# Patient Record
Sex: Male | Born: 1935
Health system: Southern US, Community
[De-identification: ages and names within clinical notes are randomized; demographics above are authoritative.]

## PROBLEM LIST (undated history)

## (undated) DIAGNOSIS — E785 Hyperlipidemia, unspecified: Secondary | ICD-10-CM

## (undated) DIAGNOSIS — N289 Disorder of kidney and ureter, unspecified: Secondary | ICD-10-CM

## (undated) DIAGNOSIS — I519 Heart disease, unspecified: Secondary | ICD-10-CM

## (undated) DIAGNOSIS — K635 Polyp of colon: Secondary | ICD-10-CM

## (undated) DIAGNOSIS — B019 Varicella without complication: Secondary | ICD-10-CM

## (undated) DIAGNOSIS — J302 Other seasonal allergic rhinitis: Secondary | ICD-10-CM

## (undated) DIAGNOSIS — I2510 Atherosclerotic heart disease of native coronary artery without angina pectoris: Secondary | ICD-10-CM

## (undated) DIAGNOSIS — M1990 Unspecified osteoarthritis, unspecified site: Secondary | ICD-10-CM

## (undated) DIAGNOSIS — M109 Gout, unspecified: Secondary | ICD-10-CM

## (undated) DIAGNOSIS — N529 Male erectile dysfunction, unspecified: Secondary | ICD-10-CM

## (undated) DIAGNOSIS — I10 Essential (primary) hypertension: Secondary | ICD-10-CM

## (undated) DIAGNOSIS — N400 Enlarged prostate without lower urinary tract symptoms: Secondary | ICD-10-CM

## (undated) HISTORY — DX: Essential (primary) hypertension: I10

## (undated) HISTORY — DX: Heart disease, unspecified: I51.9

## (undated) HISTORY — PX: CATARACT EXTRACTION: SUR2

## (undated) HISTORY — DX: Atherosclerotic heart disease of native coronary artery without angina pectoris: I25.10

## (undated) HISTORY — DX: Varicella without complication: B01.9

## (undated) HISTORY — DX: Benign prostatic hyperplasia without lower urinary tract symptoms: N40.0

## (undated) HISTORY — DX: Disorder of kidney and ureter, unspecified: N28.9

## (undated) HISTORY — PX: APPENDECTOMY: SHX54

## (undated) HISTORY — DX: Gout, unspecified: M10.9

## (undated) HISTORY — DX: Hyperlipidemia, unspecified: E78.5

## (undated) HISTORY — DX: Polyp of colon: K63.5

## (undated) HISTORY — DX: Unspecified osteoarthritis, unspecified site: M19.90

## (undated) HISTORY — DX: Male erectile dysfunction, unspecified: N52.9

## (undated) HISTORY — DX: Other seasonal allergic rhinitis: J30.2

---

## 1998-06-25 HISTORY — PX: APPENDECTOMY: SHX54

## 2014-06-25 HISTORY — PX: CORONARY ANGIOPLASTY WITH STENT PLACEMENT: SHX49

## 2015-02-07 LAB — HM COLONOSCOPY

## 2015-03-14 HISTORY — PX: OTHER SURGICAL HISTORY: SHX169

## 2015-06-26 HISTORY — PX: CATARACT EXTRACTION: SUR2

## 2015-06-28 DIAGNOSIS — M546 Pain in thoracic spine: Secondary | ICD-10-CM | POA: Diagnosis not present

## 2015-06-28 DIAGNOSIS — M9903 Segmental and somatic dysfunction of lumbar region: Secondary | ICD-10-CM | POA: Diagnosis not present

## 2015-06-28 DIAGNOSIS — M545 Low back pain: Secondary | ICD-10-CM | POA: Diagnosis not present

## 2015-06-28 DIAGNOSIS — R278 Other lack of coordination: Secondary | ICD-10-CM | POA: Diagnosis not present

## 2015-06-28 DIAGNOSIS — M9905 Segmental and somatic dysfunction of pelvic region: Secondary | ICD-10-CM | POA: Diagnosis not present

## 2015-06-28 DIAGNOSIS — M9902 Segmental and somatic dysfunction of thoracic region: Secondary | ICD-10-CM | POA: Diagnosis not present

## 2015-06-28 DIAGNOSIS — M25571 Pain in right ankle and joints of right foot: Secondary | ICD-10-CM | POA: Diagnosis not present

## 2015-06-28 DIAGNOSIS — R201 Hypoesthesia of skin: Secondary | ICD-10-CM | POA: Diagnosis not present

## 2015-06-28 DIAGNOSIS — M25559 Pain in unspecified hip: Secondary | ICD-10-CM | POA: Diagnosis not present

## 2015-07-05 DIAGNOSIS — L259 Unspecified contact dermatitis, unspecified cause: Secondary | ICD-10-CM | POA: Diagnosis not present

## 2015-07-05 DIAGNOSIS — I10 Essential (primary) hypertension: Secondary | ICD-10-CM | POA: Diagnosis not present

## 2015-07-12 DIAGNOSIS — R278 Other lack of coordination: Secondary | ICD-10-CM | POA: Diagnosis not present

## 2015-07-12 DIAGNOSIS — M9903 Segmental and somatic dysfunction of lumbar region: Secondary | ICD-10-CM | POA: Diagnosis not present

## 2015-07-12 DIAGNOSIS — M9905 Segmental and somatic dysfunction of pelvic region: Secondary | ICD-10-CM | POA: Diagnosis not present

## 2015-07-12 DIAGNOSIS — M25559 Pain in unspecified hip: Secondary | ICD-10-CM | POA: Diagnosis not present

## 2015-07-12 DIAGNOSIS — M9902 Segmental and somatic dysfunction of thoracic region: Secondary | ICD-10-CM | POA: Diagnosis not present

## 2015-07-12 DIAGNOSIS — M25571 Pain in right ankle and joints of right foot: Secondary | ICD-10-CM | POA: Diagnosis not present

## 2015-07-12 DIAGNOSIS — M546 Pain in thoracic spine: Secondary | ICD-10-CM | POA: Diagnosis not present

## 2015-07-12 DIAGNOSIS — M545 Low back pain: Secondary | ICD-10-CM | POA: Diagnosis not present

## 2015-07-12 DIAGNOSIS — R201 Hypoesthesia of skin: Secondary | ICD-10-CM | POA: Diagnosis not present

## 2015-07-15 DIAGNOSIS — M25571 Pain in right ankle and joints of right foot: Secondary | ICD-10-CM | POA: Diagnosis not present

## 2015-07-15 DIAGNOSIS — M9905 Segmental and somatic dysfunction of pelvic region: Secondary | ICD-10-CM | POA: Diagnosis not present

## 2015-07-15 DIAGNOSIS — M25559 Pain in unspecified hip: Secondary | ICD-10-CM | POA: Diagnosis not present

## 2015-07-15 DIAGNOSIS — M546 Pain in thoracic spine: Secondary | ICD-10-CM | POA: Diagnosis not present

## 2015-07-15 DIAGNOSIS — M9902 Segmental and somatic dysfunction of thoracic region: Secondary | ICD-10-CM | POA: Diagnosis not present

## 2015-07-15 DIAGNOSIS — R201 Hypoesthesia of skin: Secondary | ICD-10-CM | POA: Diagnosis not present

## 2015-07-15 DIAGNOSIS — R278 Other lack of coordination: Secondary | ICD-10-CM | POA: Diagnosis not present

## 2015-07-15 DIAGNOSIS — M9903 Segmental and somatic dysfunction of lumbar region: Secondary | ICD-10-CM | POA: Diagnosis not present

## 2015-07-15 DIAGNOSIS — M545 Low back pain: Secondary | ICD-10-CM | POA: Diagnosis not present

## 2015-07-19 DIAGNOSIS — I10 Essential (primary) hypertension: Secondary | ICD-10-CM | POA: Diagnosis not present

## 2015-07-19 DIAGNOSIS — I2510 Atherosclerotic heart disease of native coronary artery without angina pectoris: Secondary | ICD-10-CM | POA: Diagnosis not present

## 2015-07-19 DIAGNOSIS — E7800 Pure hypercholesterolemia, unspecified: Secondary | ICD-10-CM | POA: Diagnosis not present

## 2015-08-16 DIAGNOSIS — R278 Other lack of coordination: Secondary | ICD-10-CM | POA: Diagnosis not present

## 2015-08-16 DIAGNOSIS — M25571 Pain in right ankle and joints of right foot: Secondary | ICD-10-CM | POA: Diagnosis not present

## 2015-08-16 DIAGNOSIS — M542 Cervicalgia: Secondary | ICD-10-CM | POA: Diagnosis not present

## 2015-08-16 DIAGNOSIS — M545 Low back pain: Secondary | ICD-10-CM | POA: Diagnosis not present

## 2015-08-16 DIAGNOSIS — M9903 Segmental and somatic dysfunction of lumbar region: Secondary | ICD-10-CM | POA: Diagnosis not present

## 2015-08-16 DIAGNOSIS — M9902 Segmental and somatic dysfunction of thoracic region: Secondary | ICD-10-CM | POA: Diagnosis not present

## 2015-08-16 DIAGNOSIS — M546 Pain in thoracic spine: Secondary | ICD-10-CM | POA: Diagnosis not present

## 2015-08-16 DIAGNOSIS — M9901 Segmental and somatic dysfunction of cervical region: Secondary | ICD-10-CM | POA: Diagnosis not present

## 2015-08-16 DIAGNOSIS — R201 Hypoesthesia of skin: Secondary | ICD-10-CM | POA: Diagnosis not present

## 2015-08-25 DIAGNOSIS — M542 Cervicalgia: Secondary | ICD-10-CM | POA: Diagnosis not present

## 2015-08-25 DIAGNOSIS — R201 Hypoesthesia of skin: Secondary | ICD-10-CM | POA: Diagnosis not present

## 2015-08-25 DIAGNOSIS — M9903 Segmental and somatic dysfunction of lumbar region: Secondary | ICD-10-CM | POA: Diagnosis not present

## 2015-08-25 DIAGNOSIS — M25571 Pain in right ankle and joints of right foot: Secondary | ICD-10-CM | POA: Diagnosis not present

## 2015-08-25 DIAGNOSIS — M9901 Segmental and somatic dysfunction of cervical region: Secondary | ICD-10-CM | POA: Diagnosis not present

## 2015-08-25 DIAGNOSIS — M9902 Segmental and somatic dysfunction of thoracic region: Secondary | ICD-10-CM | POA: Diagnosis not present

## 2015-08-25 DIAGNOSIS — M545 Low back pain: Secondary | ICD-10-CM | POA: Diagnosis not present

## 2015-08-25 DIAGNOSIS — M546 Pain in thoracic spine: Secondary | ICD-10-CM | POA: Diagnosis not present

## 2015-08-30 DIAGNOSIS — M9902 Segmental and somatic dysfunction of thoracic region: Secondary | ICD-10-CM | POA: Diagnosis not present

## 2015-08-30 DIAGNOSIS — R278 Other lack of coordination: Secondary | ICD-10-CM | POA: Diagnosis not present

## 2015-08-30 DIAGNOSIS — M546 Pain in thoracic spine: Secondary | ICD-10-CM | POA: Diagnosis not present

## 2015-08-30 DIAGNOSIS — M9903 Segmental and somatic dysfunction of lumbar region: Secondary | ICD-10-CM | POA: Diagnosis not present

## 2015-08-30 DIAGNOSIS — M545 Low back pain: Secondary | ICD-10-CM | POA: Diagnosis not present

## 2015-08-30 DIAGNOSIS — R201 Hypoesthesia of skin: Secondary | ICD-10-CM | POA: Diagnosis not present

## 2015-08-30 DIAGNOSIS — M9901 Segmental and somatic dysfunction of cervical region: Secondary | ICD-10-CM | POA: Diagnosis not present

## 2015-08-30 DIAGNOSIS — M542 Cervicalgia: Secondary | ICD-10-CM | POA: Diagnosis not present

## 2015-08-30 DIAGNOSIS — M25571 Pain in right ankle and joints of right foot: Secondary | ICD-10-CM | POA: Diagnosis not present

## 2015-08-30 DIAGNOSIS — M62838 Other muscle spasm: Secondary | ICD-10-CM | POA: Diagnosis not present

## 2015-12-09 DIAGNOSIS — N529 Male erectile dysfunction, unspecified: Secondary | ICD-10-CM | POA: Diagnosis not present

## 2015-12-09 DIAGNOSIS — I2510 Atherosclerotic heart disease of native coronary artery without angina pectoris: Secondary | ICD-10-CM | POA: Diagnosis not present

## 2015-12-09 DIAGNOSIS — M79604 Pain in right leg: Secondary | ICD-10-CM | POA: Diagnosis not present

## 2015-12-16 DIAGNOSIS — M545 Low back pain: Secondary | ICD-10-CM | POA: Diagnosis not present

## 2015-12-16 DIAGNOSIS — M25572 Pain in left ankle and joints of left foot: Secondary | ICD-10-CM | POA: Diagnosis not present

## 2015-12-16 DIAGNOSIS — M5416 Radiculopathy, lumbar region: Secondary | ICD-10-CM | POA: Diagnosis not present

## 2015-12-16 DIAGNOSIS — M9902 Segmental and somatic dysfunction of thoracic region: Secondary | ICD-10-CM | POA: Diagnosis not present

## 2015-12-16 DIAGNOSIS — R278 Other lack of coordination: Secondary | ICD-10-CM | POA: Diagnosis not present

## 2015-12-16 DIAGNOSIS — M9903 Segmental and somatic dysfunction of lumbar region: Secondary | ICD-10-CM | POA: Diagnosis not present

## 2015-12-16 DIAGNOSIS — M9905 Segmental and somatic dysfunction of pelvic region: Secondary | ICD-10-CM | POA: Diagnosis not present

## 2015-12-16 DIAGNOSIS — R201 Hypoesthesia of skin: Secondary | ICD-10-CM | POA: Diagnosis not present

## 2015-12-16 DIAGNOSIS — M25559 Pain in unspecified hip: Secondary | ICD-10-CM | POA: Diagnosis not present

## 2015-12-16 DIAGNOSIS — M546 Pain in thoracic spine: Secondary | ICD-10-CM | POA: Diagnosis not present

## 2016-01-03 DIAGNOSIS — M545 Low back pain: Secondary | ICD-10-CM | POA: Diagnosis not present

## 2016-01-03 DIAGNOSIS — M25572 Pain in left ankle and joints of left foot: Secondary | ICD-10-CM | POA: Diagnosis not present

## 2016-01-03 DIAGNOSIS — M9902 Segmental and somatic dysfunction of thoracic region: Secondary | ICD-10-CM | POA: Diagnosis not present

## 2016-01-03 DIAGNOSIS — M9904 Segmental and somatic dysfunction of sacral region: Secondary | ICD-10-CM | POA: Diagnosis not present

## 2016-01-03 DIAGNOSIS — R201 Hypoesthesia of skin: Secondary | ICD-10-CM | POA: Diagnosis not present

## 2016-01-03 DIAGNOSIS — M546 Pain in thoracic spine: Secondary | ICD-10-CM | POA: Diagnosis not present

## 2016-01-03 DIAGNOSIS — R278 Other lack of coordination: Secondary | ICD-10-CM | POA: Diagnosis not present

## 2016-01-03 DIAGNOSIS — M25571 Pain in right ankle and joints of right foot: Secondary | ICD-10-CM | POA: Diagnosis not present

## 2016-01-03 DIAGNOSIS — M25559 Pain in unspecified hip: Secondary | ICD-10-CM | POA: Diagnosis not present

## 2016-01-03 DIAGNOSIS — M9903 Segmental and somatic dysfunction of lumbar region: Secondary | ICD-10-CM | POA: Diagnosis not present

## 2016-04-10 DIAGNOSIS — I2510 Atherosclerotic heart disease of native coronary artery without angina pectoris: Secondary | ICD-10-CM | POA: Diagnosis not present

## 2016-04-10 DIAGNOSIS — E785 Hyperlipidemia, unspecified: Secondary | ICD-10-CM | POA: Diagnosis not present

## 2016-04-10 DIAGNOSIS — I10 Essential (primary) hypertension: Secondary | ICD-10-CM | POA: Diagnosis not present

## 2016-04-16 DIAGNOSIS — L239 Allergic contact dermatitis, unspecified cause: Secondary | ICD-10-CM | POA: Diagnosis not present

## 2016-04-16 DIAGNOSIS — D229 Melanocytic nevi, unspecified: Secondary | ICD-10-CM | POA: Diagnosis not present

## 2016-04-16 DIAGNOSIS — D489 Neoplasm of uncertain behavior, unspecified: Secondary | ICD-10-CM | POA: Diagnosis not present

## 2016-04-18 DIAGNOSIS — Z23 Encounter for immunization: Secondary | ICD-10-CM | POA: Diagnosis not present

## 2016-06-08 DIAGNOSIS — R7309 Other abnormal glucose: Secondary | ICD-10-CM | POA: Diagnosis not present

## 2016-06-08 DIAGNOSIS — Z23 Encounter for immunization: Secondary | ICD-10-CM | POA: Diagnosis not present

## 2016-06-08 DIAGNOSIS — I10 Essential (primary) hypertension: Secondary | ICD-10-CM | POA: Diagnosis not present

## 2016-06-08 DIAGNOSIS — N529 Male erectile dysfunction, unspecified: Secondary | ICD-10-CM | POA: Diagnosis not present

## 2016-06-08 DIAGNOSIS — I2510 Atherosclerotic heart disease of native coronary artery without angina pectoris: Secondary | ICD-10-CM | POA: Diagnosis not present

## 2016-06-08 DIAGNOSIS — M109 Gout, unspecified: Secondary | ICD-10-CM | POA: Diagnosis not present

## 2016-06-08 DIAGNOSIS — Z9889 Other specified postprocedural states: Secondary | ICD-10-CM | POA: Diagnosis not present

## 2016-06-08 DIAGNOSIS — M21969 Unspecified acquired deformity of unspecified lower leg: Secondary | ICD-10-CM | POA: Diagnosis not present

## 2016-06-08 DIAGNOSIS — E7800 Pure hypercholesterolemia, unspecified: Secondary | ICD-10-CM | POA: Diagnosis not present

## 2016-06-08 DIAGNOSIS — F5104 Psychophysiologic insomnia: Secondary | ICD-10-CM | POA: Diagnosis not present

## 2016-07-10 DIAGNOSIS — M1712 Unilateral primary osteoarthritis, left knee: Secondary | ICD-10-CM | POA: Diagnosis not present

## 2016-07-10 DIAGNOSIS — M1711 Unilateral primary osteoarthritis, right knee: Secondary | ICD-10-CM | POA: Diagnosis not present

## 2016-07-10 DIAGNOSIS — M25562 Pain in left knee: Secondary | ICD-10-CM | POA: Diagnosis not present

## 2016-07-10 DIAGNOSIS — M25561 Pain in right knee: Secondary | ICD-10-CM | POA: Diagnosis not present

## 2016-12-07 DIAGNOSIS — I2510 Atherosclerotic heart disease of native coronary artery without angina pectoris: Secondary | ICD-10-CM | POA: Diagnosis not present

## 2016-12-07 LAB — BASIC METABOLIC PANEL
BUN: 23 — AB (ref 4–21)
CREATININE: 1.5 — AB (ref 0.6–1.3)
GLUCOSE: 113
POTASSIUM: 4.3 (ref 3.4–5.3)
Sodium: 137 (ref 137–147)

## 2016-12-07 LAB — LIPID PANEL
Cholesterol: 156 (ref 0–200)
HDL: 43 (ref 35–70)
LDL Cholesterol: 75
Triglycerides: 190 — AB (ref 40–160)

## 2016-12-07 LAB — HEPATIC FUNCTION PANEL
ALT: 17 (ref 10–40)
AST: 21 (ref 14–40)
BILIRUBIN, TOTAL: 0.6

## 2017-01-08 ENCOUNTER — Encounter: Payer: Self-pay | Admitting: Internal Medicine

## 2017-01-08 DIAGNOSIS — I10 Essential (primary) hypertension: Secondary | ICD-10-CM | POA: Diagnosis not present

## 2017-01-08 DIAGNOSIS — I2510 Atherosclerotic heart disease of native coronary artery without angina pectoris: Secondary | ICD-10-CM | POA: Diagnosis not present

## 2017-01-08 DIAGNOSIS — R7303 Prediabetes: Secondary | ICD-10-CM | POA: Diagnosis not present

## 2017-01-08 DIAGNOSIS — G4700 Insomnia, unspecified: Secondary | ICD-10-CM | POA: Diagnosis not present

## 2017-04-29 DIAGNOSIS — H6121 Impacted cerumen, right ear: Secondary | ICD-10-CM | POA: Diagnosis not present

## 2017-04-29 DIAGNOSIS — I10 Essential (primary) hypertension: Secondary | ICD-10-CM | POA: Diagnosis not present

## 2017-04-30 ENCOUNTER — Telehealth: Payer: Self-pay | Admitting: Internal Medicine

## 2017-04-30 NOTE — Telephone Encounter (Signed)
This pt called stating that he is new to the area and is looking for an internal medicare physician. He was referred to you and wanted to know if you would be willing to see him as a new patient. I think you would be a good fit for this patient. Please advise.   email: [email or web address]

## 2017-05-07 NOTE — Telephone Encounter (Signed)
Pt called back in and would like to know if either one of you would be willing to take him on has a pt?

## 2017-05-08 NOTE — Telephone Encounter (Signed)
Unfortunately, I'm not able to accept any more new patients at this time.  I'm sorry! Thank you!  

## 2017-05-08 NOTE — Telephone Encounter (Signed)
OK with me but please let him know that if it is important to him, I do not treat chronic pain with schedule II or higher narcotics in my practice. thanks

## 2017-05-09 NOTE — Telephone Encounter (Signed)
I called pt and offered him the 1st new pt with Dr Jenny Reichmann in Jan and he wanted to look at other sites 1st

## 2017-05-14 ENCOUNTER — Ambulatory Visit (INDEPENDENT_AMBULATORY_CARE_PROVIDER_SITE_OTHER): Payer: Medicare Other | Admitting: Adult Health

## 2017-05-14 ENCOUNTER — Encounter: Payer: Self-pay | Admitting: Adult Health

## 2017-05-14 VITALS — BP 130/64 | Temp 98.0°F | Ht 72.0 in | Wt 208.0 lb

## 2017-05-14 DIAGNOSIS — M25561 Pain in right knee: Secondary | ICD-10-CM | POA: Diagnosis not present

## 2017-05-14 DIAGNOSIS — G8929 Other chronic pain: Secondary | ICD-10-CM | POA: Diagnosis not present

## 2017-05-14 DIAGNOSIS — I2510 Atherosclerotic heart disease of native coronary artery without angina pectoris: Secondary | ICD-10-CM | POA: Diagnosis not present

## 2017-05-14 DIAGNOSIS — I10 Essential (primary) hypertension: Secondary | ICD-10-CM

## 2017-05-14 DIAGNOSIS — Z7689 Persons encountering health services in other specified circumstances: Secondary | ICD-10-CM | POA: Diagnosis not present

## 2017-05-14 NOTE — Patient Instructions (Signed)
It was great meeting you today   I will refer you to cardiology and orthopedics   Please follow up with me for your physical

## 2017-05-14 NOTE — Progress Notes (Signed)
Patient presents to clinic today to establish care. He is a pleasant and active  83 year old male who  has a past medical history of Arthritis, Essential hypertension, Gout, and Hyperlipidemia.   He recently moved from [place].   Acute Concerns: Establish Care   Chronic Issues: Hypertension - He takes Norvasc and Losartan   Cardiac Stent - for blocked LAD. He was followed by cardiology in [place] and would like to see someone here in [place]. Denies any chest pain or SOB   Right sided knee pain - chronic in nature. He has had a " lubricating solution" injected into his knee in the past and had good results with this but feels as though this is wearing off.   Gout - Allopurinol   Hyperlipidemia - takes Lipitor   Health Maintenance: Dental -- Routine Care  Vision -- Routine Care  Immunizations -- UTD  Colonoscopy -- Aged out    Past Medical History:  Diagnosis Date  . Arthritis   . Essential hypertension   . Gout   . Hyperlipidemia     Past Surgical History:  Procedure Laterality Date  . APPENDECTOMY    . CORONARY ANGIOPLASTY WITH STENT PLACEMENT      Current Outpatient Medications on File Prior to Visit  Medication Sig Dispense Refill  . ALLOPURINOL PO Take by mouth.    . losartan (COZAAR) 50 MG tablet Take by mouth.    Marland Kitchen PRESCRIPTION MEDICATION AMLODIPINE     No current facility-administered medications on file prior to visit.     No Known Allergies  Family History  Problem Relation Age of Onset  . Lung cancer Mother   . Aneurysm Brother   . Cancer Paternal Grandfather   . Heart attack Maternal Grandfather     Social History   Socioeconomic History  . Marital status: Unknown    Spouse name: Not on file  . Number of children: Not on file  . Years of education: Not on file  . Highest education level: Not on file  Social Needs  . Financial resource strain: Not on file  . Food insecurity - worry: Not on file  . Food insecurity - inability:  Not on file  . Transportation needs - medical: Not on file  . Transportation needs - non-medical: Not on file  Occupational History  . Not on file  Tobacco Use  . Smoking status: Never Smoker  . Smokeless tobacco: Never Used  Substance and Sexual Activity  . Alcohol use: Yes    Alcohol/week: 12.6 oz    Types: 14 Glasses of wine, 7 Standard drinks or equivalent per week  . Drug use: No  . Sexual activity: Not on file  Other Topics Concern  . Not on file  Social History Narrative  . Not on file    Review of Systems  Constitutional: Negative.   HENT: Negative.   Eyes: Negative.   Cardiovascular: Negative.   Gastrointestinal: Negative.   Genitourinary: Negative.   Musculoskeletal: Positive for joint pain (right knee).  Neurological: Negative.   Psychiatric/Behavioral: Negative.     BP 130/64 (BP Location: Left Arm)   Temp 98 F (36.7 C) (Oral)   Ht 6' (1.829 m)   Wt 208 lb (94.3 kg)   BMI 28.21 kg/m   Physical Exam  Constitutional: He is oriented to person, place, and time and well-developed, well-nourished, and in no distress. No distress.  Cardiovascular: Normal rate, regular rhythm, normal heart sounds and intact distal pulses.  Exam reveals no gallop and no friction rub.  No murmur heard. Pulmonary/Chest: Effort normal and breath sounds normal. No respiratory distress. He has no wheezes. He has no rales. He exhibits no tenderness.  Musculoskeletal: Normal range of motion. He exhibits no tenderness or deformity.  Neurological: He is alert and oriented to person, place, and time. Gait normal. GCS score is 15.  Skin: Skin is warm and dry. No rash noted. He is not diaphoretic. No erythema. No pallor.  Surgical scar on right lower extremity    Psychiatric: Mood, memory, affect and judgment normal.  Nursing note and vitals reviewed.   Assessment/Plan: 1. Encounter to establish care -Information from [facility] in [place].  Follow-up for physical exam -Encouraged  to stay active and eat a heart healthy diet   2. Essential hypertension - Well controlled. No change in medications   3. Coronary artery disease involving native coronary artery of native heart without angina pectoris  - Ambulatory referral to Cardiology  4. Chronic pain of right knee  - AMB referral to orthopedics  Dorothyann Peng, NP

## 2017-05-23 ENCOUNTER — Ambulatory Visit (INDEPENDENT_AMBULATORY_CARE_PROVIDER_SITE_OTHER): Payer: Medicare Other

## 2017-05-23 ENCOUNTER — Encounter (INDEPENDENT_AMBULATORY_CARE_PROVIDER_SITE_OTHER): Payer: Self-pay | Admitting: Orthopedic Surgery

## 2017-05-23 ENCOUNTER — Encounter: Payer: Self-pay | Admitting: Adult Health

## 2017-05-23 ENCOUNTER — Ambulatory Visit (INDEPENDENT_AMBULATORY_CARE_PROVIDER_SITE_OTHER): Payer: Medicare Other | Admitting: Adult Health

## 2017-05-23 ENCOUNTER — Ambulatory Visit (INDEPENDENT_AMBULATORY_CARE_PROVIDER_SITE_OTHER): Payer: Medicare Other | Admitting: Orthopedic Surgery

## 2017-05-23 VITALS — BP 126/74 | Temp 98.2°F | Ht 72.0 in | Wt 207.0 lb

## 2017-05-23 DIAGNOSIS — I2510 Atherosclerotic heart disease of native coronary artery without angina pectoris: Secondary | ICD-10-CM | POA: Diagnosis not present

## 2017-05-23 DIAGNOSIS — M79661 Pain in right lower leg: Secondary | ICD-10-CM

## 2017-05-23 DIAGNOSIS — E785 Hyperlipidemia, unspecified: Secondary | ICD-10-CM

## 2017-05-23 DIAGNOSIS — M25561 Pain in right knee: Secondary | ICD-10-CM

## 2017-05-23 DIAGNOSIS — G8929 Other chronic pain: Secondary | ICD-10-CM | POA: Diagnosis not present

## 2017-05-23 DIAGNOSIS — I10 Essential (primary) hypertension: Secondary | ICD-10-CM | POA: Diagnosis not present

## 2017-05-23 LAB — LIPID PANEL
CHOLESTEROL: 180 mg/dL (ref 0–200)
CHOLESTEROL: 180 (ref 0–200)
HDL: 44 (ref 35–70)
HDL: 44.6 mg/dL (ref >39.00)
LDL Cholesterol: 105
LDL Cholesterol: 105 mg/dL — ABNORMAL HIGH (ref 0–99)
NONHDL: 135
Total CHOL/HDL Ratio: 4
Triglycerides: 152 (ref 40–160)
Triglycerides: 152 mg/dL — ABNORMAL HIGH (ref 0.0–149.0)
VLDL: 30.4 mg/dL (ref 0.0–40.0)

## 2017-05-23 LAB — BASIC METABOLIC PANEL
BUN: 25 mg/dL — ABNORMAL HIGH (ref 6–23)
CO2: 27 mEq/L (ref 19–32)
Calcium: 9.9 mg/dL (ref 8.4–10.5)
Chloride: 103 mEq/L (ref 96–112)
Creatinine, Ser: 1.48 mg/dL (ref 0.40–1.50)
Creatinine: 1.5 — AB (ref 0.6–1.3)
GFR: 48.16 mL/min — ABNORMAL LOW (ref >60.00)
Glucose, Bld: 109 mg/dL — ABNORMAL HIGH (ref 70–99)
Potassium: 4.3 (ref 3.4–5.3)
Potassium: 4.3 mEq/L (ref 3.5–5.1)
Sodium: 138 mEq/L (ref 135–145)

## 2017-05-23 LAB — HEPATIC FUNCTION PANEL
ALBUMIN: 4.4 g/dL (ref 3.5–5.2)
ALK PHOS: 68 U/L (ref 39–117)
ALT: 16 U/L (ref 0–53)
AST: 19 U/L (ref 0–37)
Bilirubin, Direct: 0.1 mg/dL (ref 0.0–0.3)
TOTAL PROTEIN: 7.8 g/dL (ref 6.0–8.3)
Total Bilirubin: 0.8 mg/dL (ref 0.2–1.2)

## 2017-05-23 LAB — CBC WITH DIFFERENTIAL/PLATELET
Basophils Absolute: 0.1 10*3/uL (ref 0.0–0.1)
Basophils Relative: 0.9 % (ref 0.0–3.0)
EOS PCT: 3.8 % (ref 0.0–5.0)
Eosinophils Absolute: 0.3 10*3/uL (ref 0.0–0.7)
HEMATOCRIT: 41.7 % (ref 39.0–52.0)
Hemoglobin: 13.8 g/dL (ref 13.0–17.0)
LYMPHS ABS: 1.4 10*3/uL (ref 0.7–4.0)
LYMPHS PCT: 18.7 % (ref 12.0–46.0)
MCHC: 33.2 g/dL (ref 30.0–36.0)
MCV: 95.3 fl (ref 78.0–100.0)
MONOS PCT: 9.6 % (ref 3.0–12.0)
Monocytes Absolute: 0.7 10*3/uL (ref 0.1–1.0)
NEUTROS ABS: 5 10*3/uL (ref 1.4–7.7)
NEUTROS PCT: 67 % (ref 43.0–77.0)
PLATELETS: 274 10*3/uL (ref 150.0–400.0)
RBC: 4.38 Mil/uL (ref 4.22–5.81)
RDW: 13.6 % (ref 11.5–15.5)
WBC: 7.4 10*3/uL (ref 4.0–10.5)

## 2017-05-23 LAB — TSH
TSH: 2.68 u[IU]/mL (ref 0.35–4.50)
TSH: 2.6 (ref 0.41–5.90)

## 2017-05-23 NOTE — Progress Notes (Signed)
Subjective:    Patient ID: Kevin Levine, male    DOB: 07-Aug-1931, 83 y.o.   MRN: [id number]  HPI  Patient presents for yearly follow up exam. He is a pleasant and active 83 year old male who  has a past medical history of Arthritis, Chicken pox, Colon polyps, ED (erectile dysfunction), Essential hypertension, Gout, Heart disease, Hyperlipidemia, Kidney disease, and Seasonal allergies.   Hypertension - He takes Norvasc and Losartan - stable   Cardiac Stent - for blocked LAD. He was followed by cardiology in [place]. Denies any chest pain or SOB   Hx of Gout - Allopurinol - stable   Hyperlipidemia - takes Lipitor   All immunizations and health maintenance protocols were reviewed with the patient and needed orders were placed. He reports that he believes he has had all of his vaccinations but we have not received his records from previous PCP   Appropriate screening laboratory values were ordered for the patient including screening of hyperlipidemia, renal function and hepatic function. If indicated by BPH, a PSA was ordered.  Medication reconciliation,  past medical history, social history, problem list and allergies were reviewed in detail with the patient  Goals were established with regard to weight loss, exercise, and  diet in compliance with medications  End of life planning was discussed. He has an advanced directive and living will.   He no longer needs to have a colonoscopy. He participates in routine dental and vision screens  He will be seeing orthopedics this afternoon for his right knee pain.   Does not have any acute issues he would like to discuss   Review of Systems  Constitutional: Negative.   HENT: Negative.   Eyes: Negative.   Respiratory: Negative.   Cardiovascular: Negative.   Gastrointestinal: Negative.   Endocrine: Negative.   Genitourinary: Negative.   Musculoskeletal: Positive for arthralgias.  Skin: Negative.   Allergic/Immunologic: Negative.    Neurological: Negative.   Hematological: Negative.   Psychiatric/Behavioral: Negative.   All other systems reviewed and are negative.  Past Medical History:  Diagnosis Date  . Arthritis   . Chicken pox   . Colon polyps   . ED (erectile dysfunction)   . Essential hypertension   . Gout   . Heart disease   . Hyperlipidemia   . Kidney disease   . Seasonal allergies     Social History   Socioeconomic History  . Marital status: Significant Other    Spouse name: Not on file  . Number of children: Not on file  . Years of education: Not on file  . Highest education level: Not on file  Social Needs  . Financial resource strain: Not on file  . Food insecurity - worry: Not on file  . Food insecurity - inability: Not on file  . Transportation needs - medical: Not on file  . Transportation needs - non-medical: Not on file  Occupational History  . Not on file  Tobacco Use  . Smoking status: Never Smoker  . Smokeless tobacco: Never Used  Substance and Sexual Activity  . Alcohol use: Yes    Alcohol/week: 12.6 oz    Types: 14 Glasses of wine, 7 Standard drinks or equivalent per week  . Drug use: No  . Sexual activity: Not on file  Other Topics Concern  . Not on file  Social History Narrative   Retired - works as an Herbalist.    Past Surgical History:  Procedure Laterality Date  .  APPENDECTOMY    . CORONARY ANGIOPLASTY WITH STENT PLACEMENT  2016    Family History  Problem Relation Age of Onset  . Lung cancer Mother   . Aneurysm Brother   . Cancer Paternal Grandfather   . Heart attack Maternal Grandfather     No Known Allergies  Current Outpatient Medications on File Prior to Visit  Medication Sig Dispense Refill  . ALLOPURINOL PO Take by mouth.    Marland Kitchen atorvastatin (LIPITOR) 80 MG tablet Take 80 mg by mouth daily.    Marland Kitchen losartan (COZAAR) 50 MG tablet Take by mouth.    Marland Kitchen PRESCRIPTION MEDICATION AMLODIPINE     No current facility-administered  medications on file prior to visit.     BP 126/74 (BP Location: Left Arm)   Temp 98.2 F (36.8 C) (Oral)   Ht 6' (1.829 m)   Wt 207 lb (93.9 kg)   BMI 28.07 kg/m       Objective:   Physical Exam  Constitutional: He is oriented to person, place, and time. He appears well-developed and well-nourished. No distress.  HENT:  Head: Normocephalic and atraumatic.  Right Ear: External ear normal.  Left Ear: External ear normal.  Nose: Nose normal.  Mouth/Throat: Oropharynx is clear and moist. No oropharyngeal exudate.  Eyes: Conjunctivae and EOM are normal. Pupils are equal, round, and reactive to light. Right eye exhibits no discharge. Left eye exhibits no discharge. No scleral icterus.  Neck: Normal range of motion. Neck supple. No JVD present. Carotid bruit is not present. No tracheal deviation present. No thyroid mass and no thyromegaly present.  Cardiovascular: Normal rate, regular rhythm, normal heart sounds and intact distal pulses. Exam reveals no gallop and no friction rub.  No murmur heard. Pulmonary/Chest: Effort normal and breath sounds normal. No stridor. No respiratory distress. He has no wheezes. He has no rales. He exhibits no tenderness.  Abdominal: Soft. Bowel sounds are normal. He exhibits no distension and no mass. There is no tenderness. There is no rebound and no guarding.  Genitourinary:  Genitourinary Comments: Deferred    Musculoskeletal: Normal range of motion. He exhibits no edema, tenderness or deformity.  Lymphadenopathy:    He has no cervical adenopathy.  Neurological: He is alert and oriented to person, place, and time. He has normal reflexes. He displays normal reflexes. No cranial nerve deficit. He exhibits normal muscle tone. Coordination normal.  Skin: Skin is warm and dry. No rash noted. He is not diaphoretic. No erythema. No pallor.  Psychiatric: He has a normal mood and affect. His behavior is normal. Judgment and thought content normal.  Nursing  note and vitals reviewed.     Assessment & Plan:  1. Essential hypertension - controlled with current medication  No change in medications  - Basic metabolic panel - CBC with Differential/Platelet - Hepatic function panel - Lipid panel - TSH  2. Hyperlipidemia, unspecified hyperlipidemia type - Consider increasing statin.   - Basic metabolic panel - CBC with Differential/Platelet - Hepatic function panel - Lipid panel - TSH   Dorothyann Peng, NP

## 2017-05-26 ENCOUNTER — Encounter (INDEPENDENT_AMBULATORY_CARE_PROVIDER_SITE_OTHER): Payer: Self-pay | Admitting: Orthopedic Surgery

## 2017-05-26 DIAGNOSIS — M25561 Pain in right knee: Secondary | ICD-10-CM | POA: Diagnosis not present

## 2017-05-26 DIAGNOSIS — G8929 Other chronic pain: Secondary | ICD-10-CM | POA: Diagnosis not present

## 2017-05-26 MED ORDER — METHYLPREDNISOLONE ACETATE 40 MG/ML IJ SUSP
40.0000 mg | INTRAMUSCULAR | Status: AC | PRN
  Administered 2017-05-26: 40 mg via INTRA_ARTICULAR

## 2017-05-26 MED ORDER — BUPIVACAINE HCL 0.25 % IJ SOLN
4.0000 mL | INTRAMUSCULAR | Status: AC | PRN
Start: 2017-05-26 — End: 2017-05-26
  Administered 2017-05-26: 4 mL via INTRA_ARTICULAR

## 2017-05-26 MED ORDER — LIDOCAINE HCL 1 % IJ SOLN
5.0000 mL | INTRAMUSCULAR | Status: AC | PRN
  Administered 2017-05-26: 5 mL

## 2017-05-26 NOTE — Progress Notes (Signed)
Office Visit Note   Patient: Kevin Levine           Date of Birth: 12/25/1931           MRN: [id number] Visit Date: 05/23/2017 Requested by: Dorothyann Peng, NP [place] [address] [place], [place] [address] PCP: Dorothyann Peng, NP  Subjective: Chief Complaint  Patient presents with  . Right Knee - Pain  . Right Leg - Pain    HPI: Kevin Levine is a patient with right knee and leg pain.  Reports calf pain as well.  Believes this may be possibly damage related to his sciatic nerve.  Describes pain for 20 years.  He also reports a history of meniscal damage in the right knee.  Pain is always lateral to midline in the tibia.  He has had an injection in the right knee 9 months ago in [place] which helped.  States that that injection is still working for pain relief in his knee.  He states he has a history of right foot drop but it is not as bad as it used to be.  He denies any low back pain.  He would like to figure out what is going on.  Reports night pain in that right leg as well as pain that wakes him from sleep at night.  He states that he had surgery in the right leg "to release the nerve" and he says that that was a bad surgery.  He states that it was the electrostimulation which helped his leg and not the surgery.this  Part of his history is somewhat confusing.              ROS: All systems reviewed are negative as they relate to the chief complaint within the history of present illness.  Patient denies  fevers or chills.   Assessment & Plan: Visit Diagnoses:  1. Pain in right lower leg   2. Chronic pain of right knee     Plan: Impression is right leg pain which is coming from 1 of both resources.  First source would be the back and sciatic nerve.  Second source would be recurrent depression around the fibular head.  Third source would be compression of the superficial peroneal nerve around the ankle.  Plan at this time is for injection into the knee for mild recurrent symptoms of  arthritis which is very evident on plain radiographs.  Need nerve conduction study of that right lower extremity to localize the area of nerve compression.  I will see him back after that study  Follow-Up Instructions: No Follow-up on file.   Orders:  Orders Placed This Encounter  Procedures  . XR Knee 1-2 Views Right  . XR Tibia/Fibula Right  . Ambulatory referral to Physical Medicine Rehab   No orders of the defined types were placed in this encounter.     Procedures: Large Joint Inj: R knee on 05/26/2017 11:42 AM Indications: diagnostic evaluation, joint swelling and pain Details: 18 G 1.5 in needle, superolateral approach  Arthrogram: No  Medications: 5 mL lidocaine 1 %; 40 mg methylPREDNISolone acetate 40 MG/ML; 4 mL bupivacaine 0.25 % Outcome: tolerated well, no immediate complications Procedure, treatment alternatives, risks and benefits explained, specific risks discussed. Consent was given by the patient. Immediately prior to procedure a time out was called to verify the correct patient, procedure, equipment, support staff and site/side marked as required. Patient was prepped and draped in the usual sterile fashion.       Clinical  Data: No additional findings.  Objective: Vital Signs: There were no vitals taken for this visit.  Physical Exam:   Constitutional: Patient appears well-developed HEENT:  Head: Normocephalic Eyes:EOM are normal Neck: Normal range of motion Cardiovascular: Normal rate Pulmonary/chest: Effort normal Neurologic: Patient is alert Skin: Skin is warm Psychiatric: Patient has normal mood and affect    Ortho Exam: Orthopedic exam demonstrates slight atrophy of the right calf compared to the left calf.  This is about 1 cm.  Pedal pulses palpable bilaterally.  Ankle dorsiflexion on the right 5- out of 5 compared to 5+ out of 5 on the left.  Ankle eversion strength on the right 5- out of 5 compared to 5+ out of 5 on the left.  He has a  well-healed surgical incision for decompression of the peroneal nerve at the right knee.  There is no effusion in the right knee.  No focal joint line tenderness.  Extensor mechanism is intact.  No other masses lymphadenopathy or skin changes noted in the right knee region.  No nerve root tension signs.  No groin pain with internal/external rotation of the leg.  Minimal pain with forward and lateral bending of the back  Specialty Comments:  No specialty comments available.  Imaging: No results found.   PMFS History: There are no active problems to display for this patient.  Past Medical History:  Diagnosis Date  . Arthritis   . Chicken pox   . Colon polyps   . ED (erectile dysfunction)   . Essential hypertension   . Gout   . Heart disease   . Hyperlipidemia   . Kidney disease   . Seasonal allergies     Family History  Problem Relation Age of Onset  . Lung cancer Mother   . Aneurysm Brother   . Cancer Paternal Grandfather   . Heart attack Maternal Grandfather     Past Surgical History:  Procedure Laterality Date  . APPENDECTOMY    . CATARACT EXTRACTION Bilateral   . CORONARY ANGIOPLASTY WITH STENT PLACEMENT  2016   Social History   Occupational History  . Not on file  Tobacco Use  . Smoking status: Never Smoker  . Smokeless tobacco: Never Used  Substance and Sexual Activity  . Alcohol use: Yes    Alcohol/week: 12.6 oz    Types: 14 Glasses of wine, 7 Standard drinks or equivalent per week  . Drug use: No  . Sexual activity: Not on file

## 2017-06-03 ENCOUNTER — Telehealth: Payer: Self-pay | Admitting: Adult Health

## 2017-06-03 NOTE — Telephone Encounter (Signed)
Copied from [place]. Topic: Quick Communication - See Telephone Encounter >> Jun 03, 2017 10:50 AM Synthia Innocent wrote: CRM for notification. See Telephone encounter for:  Requesting refill on ALLOPURINOL PO, [facility] 06/03/17.

## 2017-06-04 NOTE — Telephone Encounter (Signed)
Pt had OV 05/23/17  Do not see active order.  Routed back to provider

## 2017-06-04 NOTE — Telephone Encounter (Signed)
Cory - Please advise. Thanks! 

## 2017-06-05 NOTE — Telephone Encounter (Signed)
I just need to know what dose he is on

## 2017-06-05 NOTE — Telephone Encounter (Signed)
LMTCB for pt requesting dose/strength and directions for Allopurinol

## 2017-06-05 NOTE — Telephone Encounter (Signed)
Per pt: Allopurinol - 100 mg tabs takes 1 daily in the morning.

## 2017-06-06 ENCOUNTER — Other Ambulatory Visit: Payer: Self-pay | Admitting: Adult Health

## 2017-06-06 ENCOUNTER — Encounter (INDEPENDENT_AMBULATORY_CARE_PROVIDER_SITE_OTHER): Payer: Self-pay | Admitting: Physical Medicine and Rehabilitation

## 2017-06-06 ENCOUNTER — Ambulatory Visit (INDEPENDENT_AMBULATORY_CARE_PROVIDER_SITE_OTHER): Payer: Medicare Other | Admitting: Physical Medicine and Rehabilitation

## 2017-06-06 DIAGNOSIS — R202 Paresthesia of skin: Secondary | ICD-10-CM

## 2017-06-06 DIAGNOSIS — R531 Weakness: Secondary | ICD-10-CM

## 2017-06-06 MED ORDER — ALLOPURINOL 100 MG PO TABS: 100.0000 mg | ORAL_TABLET | Freq: Every day | ORAL | 3 refills | Status: DC

## 2017-06-06 NOTE — Progress Notes (Deleted)
Had sudden right leg and back pain about 6 years ago. Had surgery in [place] to release the nerve. Did not help. Had studies in [place] due to drop foot. Operation in [place] "damaged leg." Constant pain in right leg from knee to calf. Says right leg is smaller than left.

## 2017-06-12 ENCOUNTER — Encounter (INDEPENDENT_AMBULATORY_CARE_PROVIDER_SITE_OTHER): Payer: Self-pay | Admitting: Orthopedic Surgery

## 2017-06-12 ENCOUNTER — Ambulatory Visit (INDEPENDENT_AMBULATORY_CARE_PROVIDER_SITE_OTHER): Payer: Medicare Other | Admitting: Orthopedic Surgery

## 2017-06-12 DIAGNOSIS — M1711 Unilateral primary osteoarthritis, right knee: Secondary | ICD-10-CM

## 2017-06-12 NOTE — Progress Notes (Signed)
Kevin Levine - 83 y.o. male MRN [id number]  Date of birth: 1931-07-26  Office Visit Note: Visit Date: 06/06/2017 PCP: Kevin Peng, NP Referred by: Kevin Peng, NP  Subjective: Chief Complaint  Patient presents with  . Right Leg - Numbness, Weakness  . Right Lower Leg - Numbness, Weakness   HPI: Kevin Levine is an 83 year old gentleman who comes in today at the request of Dr. Marlou Levine for electrodiagnostic study of the right lower extremity.  He reports sudden onset of right leg and back pain about 83 years ago.  He ultimately had surgery on the right knee to release the peroneal nerve.  This was done in [place].  He feels like this did not help at all and he also feels like the operation that was performed in [place] "damaged his leg ".  He reports constant pain in the right leg from the knee to the calf.  Feels like there is atrophy of the right lower leg musculature.  He reports having on top of electrodiagnostic studies done in [place] because of a drop foot.  I do not have any of those studies to review.  He denies any left-sided complaints.  He does not really have pain down the back of the leg really more from the knee down.    ROS Otherwise per HPI.  Assessment & Plan: Visit Diagnoses:  1. Paresthesia of skin   2. Weakness     Plan: No additional findings.  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  Without prior electrodiagnostic studies to review there is no way to know if the current study shows improvement or worsening of surgery 83 years ago.  And prognostically the patient does have positive motor unit action potentials and has more chronic appearing denervation potentials.  This would portend that the patient has had some reinnervation.  There is also some evidence of early polyneuropathy which may just be  age-related.  We also had some difficulty with keeping the foot warmed during the entire procedure.  There is no significant electrodiagnostic evidence of any other focal nerve entrapment, lumbosacral plexopathy or lumbar radiculopathy.   Recommendations: 1.  Follow-up with referring physician. 2.  Continue current management of symptoms.   Meds & Orders: No orders of the defined types were placed in this encounter.   Orders Placed This Encounter  Procedures  . NCV with EMG (electromyography)    Follow-up: Return in about 2 weeks (around 06/20/2017) for Dr. Marlou Levine.   Procedures: No procedures performed  EMG & NCV Findings: Evaluation of the right Dp Br Fibular motor nerve showed prolonged distal onset latency (Poplit, 7.0 ms) and decreased conduction velocity (Poplit-Fib Head, 33 m/s).  The right fibular motor nerve showed prolonged distal onset latency (7.0 ms), reduced amplitude (0.0 mV), and decreased conduction velocity (Poplt-B Fib, 20 m/s).  The right tibial motor nerve showed reduced amplitude (0.9 mV).  The right saphenous sensory nerve showed prolonged distal peak latency (4.6 ms).  The right sural sensory nerve showed prolonged distal peak latency (4.7 ms) and decreased conduction velocity (Calf-Lat Mall, 30 m/s).  All remaining nerves (as indicated in the following tables) were within normal limits.    Needle evaluation of the right anterior tibialis muscle showed increased insertional activity, moderately increased spontaneous activity, increased motor unit amplitude, very increased polyphasic potentials, and diminished recruitment.  The right Fibularis  Longus muscle showed moderately increased spontaneous activity, increased motor unit amplitude, moderately increased polyphasic potentials, and diminished recruitment.  All remaining muscles (as indicated in the following table) showed no evidence of electrical instability.    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  Without prior electrodiagnostic studies to review there is no way to know if the current study shows improvement or worsening of surgery 83 years ago.  And prognostically the patient does have positive motor unit action potentials and has more chronic appearing denervation potentials.  This would portend that the patient has had some reinnervation.  There is also some evidence of early polyneuropathy which may just be age-related.  We also had some difficulty with keeping the foot warmed during the entire procedure.  There is no significant electrodiagnostic evidence of any other focal nerve entrapment, lumbosacral plexopathy or lumbar radiculopathy.   Recommendations: 1.  Follow-up with referring physician. 2.  Continue current management of symptoms.  Nerve Conduction Studies Anti Sensory Summary Table   Stim Site NR Peak (ms) Norm Peak (ms) P-T Amp (V) Norm P-T Amp Site1 Site2 Delta-P (ms) Dist (cm) Vel (m/s) Norm Vel (m/s)  Right Saphenous Anti Sensory (Ant Med Mall)  28.4C  14cm    *4.6 <4.4 3.4 >2 14cm Ant Med Mall 4.6 0.0  >32  Right Sup Fibular Anti Sensory (Ant Lat Mall)  28.4C  14 cm    4.4 <4.4 21.3 >5.0 14 cm Ant Lat Mall 4.4 14.0 32 >32  Right Sural Anti Sensory (Lat Mall)  28.5C  Calf    *4.7 <4.0 6.8 >5.0 Calf Lat Mall 4.7 14.0 *30 >35   Motor Summary Table   Stim Site NR Onset (ms) Norm Onset (ms) O-P Amp (mV) Norm O-P Amp Site1 Site2 Delta-0 (ms) Dist (cm) Vel (m/s) Norm Vel (m/s)  Right Dp Br Fibular Motor (AntTibialis)  28.6C  Fib Head    4.0 <4.2 2.2  Poplit Fib Head 3.0 10.0 *33 >40.5  Poplit    *7.0 <5.7 1.9         Right Fibular Motor (Ext Dig Brev)  28.7C  Ankle    *7.0 <6.1 *0.0 >2.5 B Fib Ankle 9.9 40.0 40 >38  B Fib    16.9  0.0  Poplt B Fib 5.1 10.0 *20 >40  Poplt    22.0  0.0         Right Tibial  Motor (Abd Hall Brev)  28.6C  Ankle    5.5 <6.1 *0.9 >3.0 Knee Ankle 10.6 44.0 42 >35  Knee    16.1  4.5          EMG   Side Muscle Nerve Root Ins Act Fibs Psw Amp Dur Poly Recrt Int Fraser Din Comment  Right AntTibialis Dp Br Peron L4-5 *Incr *2+ *2+ *Incr Nml *3+ *Reduced Nml   Right Fibularis Longus  Sup Br Peron L5-S1 Nml *2+ *2+ *Incr Nml *2+ *Reduced Nml   Right MedGastroc Tibial S1-2 Nml Nml Nml Nml Nml 0 Nml Nml   Right VastusMed Femoral L2-4 Nml Nml Nml Nml Nml 0 Nml Nml   Right BicepsFemS Sciatic L5-S1 Nml Nml Nml Nml Nml 0 Nml Nml     Nerve Conduction Studies Anti Sensory Left/Right Comparison   Stim Site L Lat (ms) R Lat (ms) L-R Lat (ms) L Amp (V) R Amp (V) L-R  Amp (%) Site1 Site2 L Vel (m/s) R Vel (m/s) L-R Vel (m/s)  Saphenous Anti Sensory (Ant Med Mall)  28.4C  14cm  *4.6   3.4  14cm Ant Med Mall     Sup Fibular Anti Sensory (Ant Lat Mall)  28.4C  14 cm  4.4   21.3  14 cm Ant Lat Mall  32   Sural Anti Sensory (Lat Mall)  28.5C  Calf  *4.7   6.8  Calf Lat Mall  *30    Motor Left/Right Comparison   Stim Site L Lat (ms) R Lat (ms) L-R Lat (ms) L Amp (mV) R Amp (mV) L-R Amp (%) Site1 Site2 L Vel (m/s) R Vel (m/s) L-R Vel (m/s)  Dp Br Fibular Motor (AntTibialis)  28.6C  Fib Head  4.0   2.2  Poplit Fib Head  *33   Poplit  *7.0   1.9        Fibular Motor (Ext Dig Brev)  28.7C  Ankle  *7.0   *0.0  B Fib Ankle  40   B Fib  16.9   0.0  Poplt B Fib  *20   Poplt  22.0   0.0        Tibial Motor (Abd Hall Brev)  28.6C  Ankle  5.5   *0.9  Knee Ankle  42   Knee  16.1   4.5           Waveforms:             Clinical History: No specialty comments available.  He reports that  has never smoked. he has never used smokeless tobacco. No results for input(s): HGBA1C, LABURIC in the last 8760 hours.  Objective:  VS:  HT:    WT:   BMI:     BP:   HR: bpm  TEMP: ( )  RESP:  Physical Exam  Musculoskeletal: He exhibits edema.  Examination of both lower limb shows a  well-healed surgical scar over the fibular head on the right.  I did not physically measure the circumference of the calves and musculature but there does appear to be may be some mild atrophy on the right compared to left.  There is bilateral and symmetric atrophy of the bilateral foot intrinsic musculature particularly the EDB.  Neurological: He exhibits normal muscle tone. Coordination normal.  Patient has subjective dysesthesia to light touch in the peroneal distribution on the right.  He does have intact sensation.  There is no clonus bilaterally  Skin: Skin is warm and dry. No rash noted. No erythema.    Ortho Exam Imaging: No results found.  Past Medical/Family/Surgical/Social History: Medications & Allergies reviewed per EMR There are no active problems to display for this patient.  Past Medical History:  Diagnosis Date  . Arthritis   . Chicken pox   . Colon polyps   . ED (erectile dysfunction)   . Essential hypertension   . Gout   . Heart disease   . Hyperlipidemia   . Kidney disease   . Seasonal allergies    Family History  Problem Relation Age of Onset  . Lung cancer Mother   . Aneurysm Brother   . Cancer Paternal Grandfather   . Heart attack Maternal Grandfather    Past Surgical History:  Procedure Laterality Date  . APPENDECTOMY    . CATARACT EXTRACTION Bilateral   . CORONARY ANGIOPLASTY WITH STENT PLACEMENT  2016   Social History   Occupational History  . Not on  file  Tobacco Use  . Smoking status: Never Smoker  . Smokeless tobacco: Never Used  Substance and Sexual Activity  . Alcohol use: Yes    Alcohol/week: 12.6 oz    Types: 14 Glasses of wine, 7 Standard drinks or equivalent per week  . Drug use: No  . Sexual activity: Not on file

## 2017-06-12 NOTE — Procedures (Signed)
EMG & NCV Findings: Evaluation of the right Dp Br Fibular motor nerve showed prolonged distal onset latency (Poplit, 7.0 ms) and decreased conduction velocity (Poplit-Fib Head, 33 m/s).  The right fibular motor nerve showed prolonged distal onset latency (7.0 ms), reduced amplitude (0.0 mV), and decreased conduction velocity (Poplt-B Fib, 20 m/s).  The right tibial motor nerve showed reduced amplitude (0.9 mV).  The right saphenous sensory nerve showed prolonged distal peak latency (4.6 ms).  The right sural sensory nerve showed prolonged distal peak latency (4.7 ms) and decreased conduction velocity (Calf-Lat Mall, 30 m/s).  All remaining nerves (as indicated in the following tables) were within normal limits.    Needle evaluation of the right anterior tibialis muscle showed increased insertional activity, moderately increased spontaneous activity, increased motor unit amplitude, very increased polyphasic potentials, and diminished recruitment.  The right Fibularis Longus muscle showed moderately increased spontaneous activity, increased motor unit amplitude, moderately increased polyphasic potentials, and diminished recruitment.  All remaining muscles (as indicated in the following table) showed no evidence of electrical instability.    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  Without prior electrodiagnostic studies to review there is no way to know if the current study shows improvement or worsening of surgery 6 years ago.  And prognostically the patient does have positive motor unit action potentials and has more chronic appearing denervation potentials.  This would portend that the patient has had some reinnervation.  There is also some evidence of early polyneuropathy which may just be age-related.  We also had some difficulty with keeping  the foot warmed during the entire procedure.  There is no significant electrodiagnostic evidence of any other focal nerve entrapment, lumbosacral plexopathy or lumbar radiculopathy.   Recommendations: 1.  Follow-up with referring physician. 2.  Continue current management of symptoms.  Nerve Conduction Studies Anti Sensory Summary Table   Stim Site NR Peak (ms) Norm Peak (ms) P-T Amp (V) Norm P-T Amp Site1 Site2 Delta-P (ms) Dist (cm) Vel (m/s) Norm Vel (m/s)  Right Saphenous Anti Sensory (Ant Med Mall)  28.4C  14cm    *4.6 <4.4 3.4 >2 14cm Ant Med Mall 4.6 0.0  >32  Right Sup Fibular Anti Sensory (Ant Lat Mall)  28.4C  14 cm    4.4 <4.4 21.3 >5.0 14 cm Ant Lat Mall 4.4 14.0 32 >32  Right Sural Anti Sensory (Lat Mall)  28.5C  Calf    *4.7 <4.0 6.8 >5.0 Calf Lat Mall 4.7 14.0 *30 >35   Motor Summary Table   Stim Site NR Onset (ms) Norm Onset (ms) O-P Amp (mV) Norm O-P Amp Site1 Site2 Delta-0 (ms) Dist (cm) Vel (m/s) Norm Vel (m/s)  Right Dp Br Fibular Motor (AntTibialis)  28.6C  Fib Head    4.0 <4.2 2.2  Poplit Fib Head 3.0 10.0 *33 >40.5  Poplit    *7.0 <5.7 1.9         Right Fibular Motor (Ext Dig Brev)  28.7C  Ankle    *7.0 <6.1 *0.0 >2.5 B Fib Ankle 9.9 40.0 40 >38  B Fib    16.9  0.0  Poplt B Fib 5.1 10.0 *20 >40  Poplt    22.0  0.0         Right Tibial Motor (Abd Hall Brev)  28.6C  Ankle    5.5 <6.1 *0.9 >3.0 Knee  Ankle 10.6 44.0 42 >35  Knee    16.1  4.5          EMG   Side Muscle Nerve Root Ins Act Fibs Psw Amp Dur Poly Recrt Int Fraser Din Comment  Right AntTibialis Dp Br Peron L4-5 *Incr *2+ *2+ *Incr Nml *3+ *Reduced Nml   Right Fibularis Longus  Sup Br Peron L5-S1 Nml *2+ *2+ *Incr Nml *2+ *Reduced Nml   Right MedGastroc Tibial S1-2 Nml Nml Nml Nml Nml 0 Nml Nml   Right VastusMed Femoral L2-4 Nml Nml Nml Nml Nml 0 Nml Nml   Right BicepsFemS Sciatic L5-S1 Nml Nml Nml Nml Nml 0 Nml Nml     Nerve Conduction Studies Anti Sensory Left/Right Comparison   Stim Site L  Lat (ms) R Lat (ms) L-R Lat (ms) L Amp (V) R Amp (V) L-R Amp (%) Site1 Site2 L Vel (m/s) R Vel (m/s) L-R Vel (m/s)  Saphenous Anti Sensory (Ant Med Mall)  28.4C  14cm  *4.6   3.4  14cm Ant Med Mall     Sup Fibular Anti Sensory (Ant Lat Mall)  28.4C  14 cm  4.4   21.3  14 cm Ant Lat Mall  32   Sural Anti Sensory (Lat Mall)  28.5C  Calf  *4.7   6.8  Calf Lat Mall  *30    Motor Left/Right Comparison   Stim Site L Lat (ms) R Lat (ms) L-R Lat (ms) L Amp (mV) R Amp (mV) L-R Amp (%) Site1 Site2 L Vel (m/s) R Vel (m/s) L-R Vel (m/s)  Dp Br Fibular Motor (AntTibialis)  28.6C  Fib Head  4.0   2.2  Poplit Fib Head  *33   Poplit  *7.0   1.9        Fibular Motor (Ext Dig Brev)  28.7C  Ankle  *7.0   *0.0  B Fib Ankle  40   B Fib  16.9   0.0  Poplt B Fib  *20   Poplt  22.0   0.0        Tibial Motor (Abd Hall Brev)  28.6C  Ankle  5.5   *0.9  Knee Ankle  42   Knee  16.1   4.5           Waveforms:

## 2017-06-15 ENCOUNTER — Encounter (INDEPENDENT_AMBULATORY_CARE_PROVIDER_SITE_OTHER): Payer: Self-pay | Admitting: Orthopedic Surgery

## 2017-06-15 DIAGNOSIS — M1711 Unilateral primary osteoarthritis, right knee: Secondary | ICD-10-CM | POA: Diagnosis not present

## 2017-06-15 MED ORDER — LIDOCAINE HCL 1 % IJ SOLN
5.0000 mL | INTRAMUSCULAR | Status: AC | PRN
  Administered 2017-06-15: 5 mL

## 2017-06-15 MED ORDER — HYALURONAN 88 MG/4ML IX SOSY
88.0000 mg | PREFILLED_SYRINGE | INTRA_ARTICULAR | Status: AC | PRN
  Administered 2017-06-15: 88 mg via INTRA_ARTICULAR

## 2017-06-15 NOTE — Progress Notes (Signed)
Office Visit Note   Patient: Kevin Levine           Date of Birth: 1932/06/16           MRN: [id number] Visit Date: 06/12/2017 Requested by: Dorothyann Peng, NP [address], [place] [address] PCP: Dorothyann Peng, NP  Subjective: Chief Complaint  Patient presents with  . Follow-up    EMG/NCV review    HPI: Presents for follow-up of right knee EMG nerve study.  Patient is also having pain in the right knee from arthritis.  The nerve study is reviewed with the patient and it shows residual peroneal nerve compression as expected.  Patient also has some arthritis in the right knee.  Scan is reviewed at length.  Patient does not really want any surgical intervention in the right knee.              ROS: All systems reviewed are negative as they relate to the chief complaint within the history of present illness.  Patient denies  fevers or chills.   Assessment & Plan: Visit Diagnoses:  1. Unilateral primary osteoarthritis, right knee     Plan: Impression is right knee pain with arthritis.  Some residual nerve compression which does not really merit any further surgical intervention.  Plan is right knee intra-articular gel injection and follow-up as needed Follow-Up Instructions: Return if symptoms worsen or fail to improve.   Orders:  No orders of the defined types were placed in this encounter.  No orders of the defined types were placed in this encounter.     Procedures: Large Joint Inj: R knee on 06/15/2017 9:33 AM Indications: diagnostic evaluation, joint swelling and pain Details: 18 G 1.5 in needle, superolateral approach  Arthrogram: No  Medications: 5 mL lidocaine 1 %; 88 mg Hyaluronan 88 MG/4ML Outcome: tolerated well, no immediate complications Procedure, treatment alternatives, risks and benefits explained, specific risks discussed. Consent was given by the patient. Immediately prior to procedure a time out was called to verify the correct patient,  procedure, equipment, support staff and site/side marked as required. Patient was prepped and draped in the usual sterile fashion.       Clinical Data: No additional findings.  Objective: Vital Signs: There were no vitals taken for this visit.  Physical Exam:   Constitutional: Patient appears well-developed HEENT:  Head: Normocephalic Eyes:EOM are normal Neck: Normal range of motion Cardiovascular: Normal rate Pulmonary/chest: Effort normal Neurologic: Patient is alert Skin: Skin is warm Psychiatric: Patient has normal mood and affect    Ortho Exam: Orthopedic exam demonstrates fairly normal gait and alignment.  Well-healed surgical incision along the outside aspect of the right knee.  No effusion in the right knee.  Extensor mechanism is intact.  No other masses lymphadenopathy or skin changes noted in the right knee region.  No groin pain with internal/external rotation of the leg.  No effusion in the right knee.  Extensor mechanism is intact.  Motor sensory function to the foot okay with slight weakness in dorsiflexion.  Specialty Comments:  No specialty comments available.  Imaging: No results found.   PMFS History: There are no active problems to display for this patient.  Past Medical History:  Diagnosis Date  . Arthritis   . Chicken pox   . Colon polyps   . ED (erectile dysfunction)   . Essential hypertension   . Gout   . Heart disease   . Hyperlipidemia   . Kidney disease   .  Seasonal allergies     Family History  Problem Relation Age of Onset  . Lung cancer Mother   . Aneurysm Brother   . Cancer Paternal Grandfather   . Heart attack Maternal Grandfather     Past Surgical History:  Procedure Laterality Date  . APPENDECTOMY    . CATARACT EXTRACTION Bilateral   . CORONARY ANGIOPLASTY WITH STENT PLACEMENT  2016   Social History   Occupational History  . Not on file  Tobacco Use  . Smoking status: Never Smoker  . Smokeless tobacco: Never Used   Substance and Sexual Activity  . Alcohol use: Yes    Alcohol/week: 12.6 oz    Types: 14 Glasses of wine, 7 Standard drinks or equivalent per week  . Drug use: No  . Sexual activity: Not on file

## 2017-07-09 ENCOUNTER — Telehealth: Payer: Self-pay | Admitting: Adult Health

## 2017-07-09 MED ORDER — ATORVASTATIN CALCIUM 80 MG PO TABS: 80.0000 mg | ORAL_TABLET | Freq: Every day | ORAL | 3 refills | Status: AC

## 2017-07-09 MED ORDER — ALLOPURINOL 100 MG PO TABS: 100.0000 mg | ORAL_TABLET | Freq: Every day | ORAL | 3 refills | Status: AC

## 2017-07-09 MED ORDER — AMLODIPINE BESYLATE 5 MG PO TABS: 5.0000 mg | ORAL_TABLET | Freq: Every day | ORAL | 3 refills | Status: DC

## 2017-07-09 MED ORDER — LOSARTAN POTASSIUM 50 MG PO TABS: 50.0000 mg | ORAL_TABLET | Freq: Every day | ORAL | 3 refills | Status: DC

## 2017-07-09 NOTE — Telephone Encounter (Signed)
Kevin Levine, newly established with you.  Please advise.

## 2017-07-09 NOTE — Telephone Encounter (Signed)
Sent to the pharmacy by e-scribe. 

## 2017-07-09 NOTE — Telephone Encounter (Signed)
Copied from [place] [id number]. Topic: Quick Communication - Rx Refill/Question >> Jul 09, 2017 10:30 AM Malena Catholic I, NT wrote: Medication: Zyloprim 100 mg,Lipitor 80 mg, Cozaar 50 mg ,Amlodipine 5 mg   Has the patient contacted their pharmacy  yes    (Agent: If no, request that the patient contact the pharmacy for the refill.ye   Preferred Pharmacy (with phone number or street name [facility] [place] [phone]  Agent: Please be advised that RX refills may take up to 3 business days. We ask that you follow-up with your pharmacy.

## 2017-07-09 NOTE — Telephone Encounter (Signed)
Ok to refill all for one year

## 2017-08-05 ENCOUNTER — Ambulatory Visit: Payer: Medicare Other | Admitting: Internal Medicine

## 2017-08-26 ENCOUNTER — Encounter (INDEPENDENT_AMBULATORY_CARE_PROVIDER_SITE_OTHER): Payer: Self-pay | Admitting: Orthopedic Surgery

## 2017-08-26 ENCOUNTER — Ambulatory Visit (INDEPENDENT_AMBULATORY_CARE_PROVIDER_SITE_OTHER): Payer: Medicare Other | Admitting: Orthopedic Surgery

## 2017-08-26 DIAGNOSIS — M1711 Unilateral primary osteoarthritis, right knee: Secondary | ICD-10-CM | POA: Diagnosis not present

## 2017-08-26 NOTE — Progress Notes (Signed)
Office Visit Note   Patient: Kevin Levine           Date of Birth: 10-19-1931           MRN: [id number] Visit Date: 08/26/2017 Requested by: Dorothyann Peng, NP [address], [place] [address] PCP: Dorothyann Peng, NP  Subjective: Chief Complaint  Patient presents with  . Right Leg - Pain    HPI: [name] is a patient with right knee pain and arthritis.  He is 84 years old but generally physiologically a little bit younger.  States that his knee is gotten some worse.  Has difficulty with stairs.  States that he is favoring the knee.  Does have a history of pseudo-drop foot on the right-hand side but on my last exam his strength was fairly symmetric.  Does report some occasional waking from sleep at night with pain in the knee.  Wants to discuss options.  He works as an Retail buyer.  He had cortisone and gel injection in November of last year and December of last year which did not help much.  He has tried a copper type brace which helped some.  He has a history of kidney problems in the family and does not want to take anti-inflammatories.              ROS: All systems reviewed are negative as they relate to the chief complaint within the history of present illness.  Patient denies  fevers or chills.   Assessment & Plan: Visit Diagnoses:  1. Unilateral primary osteoarthritis, right knee     Plan: Impression is right knee pain and arthritis.  Patient is physiologically younger than his chronologic age.  He needs to decide if he wants a knee replacement.  He cannot take anti-inflammatories because of kidney issues.  We will try some intensive physical therapy to get the quad stronger to see if that helps his pain.  Short of that he is going to have to either live with what he has or consider total knee replacement.  Even though his medial compartment arthritis is most significant radiographically he does describe global pain and I do not think he would be a great candidate for  partial knee replacement.  Follow-Up Instructions: Return if symptoms worsen or fail to improve.   Orders:  No orders of the defined types were placed in this encounter.  No orders of the defined types were placed in this encounter.     Procedures: No procedures performed   Clinical Data: No additional findings.  Objective: Vital Signs: There were no vitals taken for this visit.  Physical Exam:   Constitutional: Patient appears well-developed HEENT:  Head: Normocephalic Eyes:EOM are normal Neck: Normal range of motion Cardiovascular: Normal rate Pulmonary/chest: Effort normal Neurologic: Patient is alert Skin: Skin is warm Psychiatric: Patient has normal mood and affect    Ortho Exam: Orthopedic exam demonstrates no effusion in the right knee.  There is a little bit of synovitis.  Collateral and cruciate ligaments are stable.  He has had about a 10 degree flexion contracture but bends easily past 90 degrees.  Pedal pulses are trace palpable.  No groin pain with internal/external rotation of the leg.  Does not have medial greater than lateral joint line tenderness.  Specialty Comments:  No specialty comments available.  Imaging: No results found.   PMFS History: There are no active problems to display for this patient.  Past Medical History:  Diagnosis Date  . Arthritis   .  Chicken pox   . Colon polyps   . ED (erectile dysfunction)   . Essential hypertension   . Gout   . Heart disease   . Hyperlipidemia   . Kidney disease   . Seasonal allergies     Family History  Problem Relation Age of Onset  . Lung cancer Mother   . Aneurysm Brother   . Cancer Paternal Grandfather   . Heart attack Maternal Grandfather     Past Surgical History:  Procedure Laterality Date  . APPENDECTOMY    . CATARACT EXTRACTION Bilateral   . CORONARY ANGIOPLASTY WITH STENT PLACEMENT  2016   Social History   Occupational History  . Not on file  Tobacco Use  . Smoking  status: Never Smoker  . Smokeless tobacco: Never Used  Substance and Sexual Activity  . Alcohol use: Yes    Alcohol/week: 12.6 oz    Types: 14 Glasses of wine, 7 Standard drinks or equivalent per week  . Drug use: No  . Sexual activity: Not on file

## 2017-08-27 ENCOUNTER — Ambulatory Visit: Payer: Medicare Other | Admitting: Internal Medicine

## 2017-08-27 DIAGNOSIS — M25561 Pain in right knee: Secondary | ICD-10-CM | POA: Diagnosis not present

## 2017-09-03 ENCOUNTER — Telehealth: Payer: Self-pay | Admitting: Internal Medicine

## 2017-09-03 NOTE — Telephone Encounter (Signed)
Received incoming records from [facility] for upcoming appointment on 09/06/17 @ 2:45pm with Dr. Debara Pickett. Records located in Medical Records. 09/03/17 ab

## 2017-09-06 ENCOUNTER — Encounter: Payer: Medicare Other | Admitting: Internal Medicine

## 2017-09-06 ENCOUNTER — Encounter: Payer: Self-pay | Admitting: Internal Medicine

## 2017-09-09 ENCOUNTER — Telehealth: Payer: Self-pay | Admitting: *Deleted

## 2017-09-09 DIAGNOSIS — I2510 Atherosclerotic heart disease of native coronary artery without angina pectoris: Secondary | ICD-10-CM

## 2017-09-09 NOTE — Telephone Encounter (Signed)
Copied from [place] [id number]. Topic: Referral - Request >> Sep 09, 2017  1:41 PM Arletha Grippe wrote: Reason for CRM: pt is asking for a referral for a stress test.  He said he hasn't had one in a while.  Please refer him [facility] dr Nadyne Coombes   Cb [phone]  for pt

## 2017-09-09 NOTE — Progress Notes (Signed)
No encounter was performed.

## 2017-09-10 ENCOUNTER — Other Ambulatory Visit: Payer: Self-pay | Admitting: Adult Health

## 2017-09-10 NOTE — Progress Notes (Unsigned)
   Subjective:    Patient ID: Kevin Levine, male    DOB: [birth year], 84 y.o.   MRN: [id number]  HPI    Review of Systems     Objective:   Physical Exam        Assessment & Plan:

## 2017-09-10 NOTE — Telephone Encounter (Signed)
Spoke to the pt.  He "fired" his last cardio physician.  Now requesting to see Dr. Nadyne Coombes.  Will place referral.  Informed pt that he is now due for annual visit with [facility].  Pt to call back to schedule.

## 2017-09-11 ENCOUNTER — Other Ambulatory Visit: Payer: Self-pay | Admitting: Adult Health

## 2017-09-11 DIAGNOSIS — I519 Heart disease, unspecified: Secondary | ICD-10-CM

## 2017-09-11 DIAGNOSIS — Z955 Presence of coronary angioplasty implant and graft: Secondary | ICD-10-CM

## 2017-09-12 ENCOUNTER — Telehealth: Payer: Self-pay | Admitting: Internal Medicine

## 2017-09-12 ENCOUNTER — Encounter: Payer: Self-pay | Admitting: Family Medicine

## 2017-09-12 NOTE — Telephone Encounter (Signed)
09/11/17 - Faxed records from [facility] to Dr. Krista Blue office. Spoke with Ria Comment from Dr. Irven Shelling office. Ria Comment confirmed they received the records. Called patient. Patient is aware Dr. Irven Shelling office has records from [facility]. ab

## 2017-09-13 ENCOUNTER — Other Ambulatory Visit: Payer: Self-pay | Admitting: Adult Health

## 2017-09-13 DIAGNOSIS — Z955 Presence of coronary angioplasty implant and graft: Secondary | ICD-10-CM

## 2017-09-19 DIAGNOSIS — N183 Chronic kidney disease, stage 3 (moderate): Secondary | ICD-10-CM | POA: Diagnosis not present

## 2017-09-19 DIAGNOSIS — R600 Localized edema: Secondary | ICD-10-CM | POA: Diagnosis not present

## 2017-09-19 DIAGNOSIS — I10 Essential (primary) hypertension: Secondary | ICD-10-CM | POA: Diagnosis not present

## 2017-09-19 DIAGNOSIS — R6889 Other general symptoms and signs: Secondary | ICD-10-CM | POA: Diagnosis not present

## 2017-09-19 DIAGNOSIS — I2510 Atherosclerotic heart disease of native coronary artery without angina pectoris: Secondary | ICD-10-CM | POA: Diagnosis not present

## 2017-09-27 ENCOUNTER — Encounter: Payer: Self-pay | Admitting: Adult Health

## 2017-10-01 ENCOUNTER — Telehealth: Payer: Self-pay | Admitting: Family Medicine

## 2017-10-01 ENCOUNTER — Telehealth: Payer: Self-pay | Admitting: Adult Health

## 2017-10-01 MED ORDER — LOSARTAN POTASSIUM 100 MG PO TABS
100.0000 mg | ORAL_TABLET | Freq: Every day | ORAL | 1 refills | Status: AC
Start: 2017-10-01 — End: ?

## 2017-10-01 NOTE — Telephone Encounter (Signed)
Call to pharmacy- they shipped Rx 3/24- patient should have 90 day supply. Call to patient- he will check his bottle.   Patient also wants to know when his colonoscopy is due. Told patient I would send a note and find out. He is expecting a call to let him know.

## 2017-10-01 NOTE — Telephone Encounter (Signed)
Copied from [place] [id number]. Topic: Quick Communication - Rx Refill/Question >> Oct 01, 2017 12:38 PM Yvette Rack wrote: Medication: amLODipine (NORVASC) 5 MG tablet Has the patient contacted their pharmacy? No.  Because pt states that its a mail order (Agent: If no, request that the patient contact the pharmacy for the refill.) Preferred Pharmacy (with phone number or street name):     [place], [place] [phone] (Phone) [phone] (Fax)     Agent: Please be advised that RX refills may take up to 3 business days. We ask that you follow-up with your pharmacy.

## 2017-10-01 NOTE — Telephone Encounter (Signed)
Cory, I do not see that you changed the dosage.  50 mg is what the pt reported when he established.  Please advise.

## 2017-10-01 NOTE — Telephone Encounter (Signed)
I do have him taking Losartan 50 mg - because this is what he told me he was taking. His notes from [place] have him on 100 mg. Ok to change dose. Refill for 6 months

## 2017-10-01 NOTE — Telephone Encounter (Signed)
Sent to the pharmacy by e-scribe. 

## 2017-10-01 NOTE — Telephone Encounter (Signed)
Copied from [place] [id number]. Topic: General - Other >> Oct 01, 2017 12:43 PM Yvette Rack wrote: Reason for CRM: patient stating that the losartan (COZAAR) 50 MG tablet is not suppose to be 50 mg that he is suppose to be taking 100mg  once daily please call pt with correct dosage

## 2017-10-02 NOTE — Telephone Encounter (Signed)
Pt is 86 years.  Should he need another colonoscopy?

## 2017-10-02 NOTE — Telephone Encounter (Signed)
Pt notified of below message.  He will call his insurance company to see if they will pay for a colonoscopy.  No further action required.  Will close note.

## 2017-10-02 NOTE — Telephone Encounter (Signed)
Colonoscopies are not recommended after 75 years. If he would like another one then I would have him check with his insurance company to make sure they will pay for it

## 2017-10-03 ENCOUNTER — Encounter: Payer: Self-pay | Admitting: Adult Health

## 2017-10-04 ENCOUNTER — Telehealth: Payer: Self-pay

## 2017-10-04 NOTE — Telephone Encounter (Signed)
Copied from [place] [id number]. Topic: General - Other >> Oct 01, 2017 12:36 PM Yvette Rack wrote: Reason for CRM: patient want to know if its time for him to have a colonoscopy

## 2017-10-07 DIAGNOSIS — I2510 Atherosclerotic heart disease of native coronary artery without angina pectoris: Secondary | ICD-10-CM | POA: Diagnosis not present

## 2017-10-07 DIAGNOSIS — R6889 Other general symptoms and signs: Secondary | ICD-10-CM | POA: Diagnosis not present

## 2017-10-07 NOTE — Telephone Encounter (Signed)
Does not look like pt is due for one. Sent to PCP to verify.

## 2017-10-08 NOTE — Telephone Encounter (Signed)
This topic has already been addressed

## 2017-10-09 ENCOUNTER — Encounter: Payer: Self-pay | Admitting: Family Medicine

## 2017-10-16 DIAGNOSIS — Z136 Encounter for screening for cardiovascular disorders: Secondary | ICD-10-CM | POA: Diagnosis not present

## 2017-10-16 DIAGNOSIS — I2510 Atherosclerotic heart disease of native coronary artery without angina pectoris: Secondary | ICD-10-CM | POA: Diagnosis not present

## 2017-10-17 ENCOUNTER — Ambulatory Visit: Payer: Self-pay

## 2017-10-17 NOTE — Telephone Encounter (Signed)
Spoke to pt's wife with authorization from the pt.  Advised a short course of Nsaids for 2 days.  Tylenol also an option.  Will try ibuprofen for a few days due to some swelling.  Will call to schedule an appointment if needed.

## 2017-10-17 NOTE — Telephone Encounter (Signed)
He can take a short course of Nsaids for 2 days without issues. Tylenol is also an option. I cannot prescribe anything stronger without an office visit

## 2017-10-17 NOTE — Telephone Encounter (Signed)
Pt is calling for advice on what to take for his right lower leg pain.  Pain is located at the right side of his knee that travels down the right side of his calf down to his right ankle.  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 He is limping when he walks. His walking pace has decreased.  Pt very frustrated during call. He rates his pain as moderate.  He states that he is hesitant to take NSAIDS due "my h/o kidney disease" He states he has h/o nerve damage and foot drop and that his knees are "deteriorated." Pt refused an appt because he is at an [employer].  Pt requesting advice or rx sent to [facility] at [facility] [employer].  Reason for Disposition . [1] MODERATE pain (e.g., interferes with normal activities, limping) AND [2] present > 3 days    Pt refusing appt. 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  Answer Assessment - Initial Assessment Questions 1. ONSET: "When did the pain start?"      Has had it before but is worsened due to increased ambulation. 2. LOCATION: "Where is the pain located?"      Right side of knee that travels down the right side of his calf down to the right side of his ankle. 3. PAIN: "How bad is the pain?"    (Scale 1-10; or mild, moderate, severe)   -  MILD (1-3): doesn't interfere with normal activities    -  MODERATE (4-7): interferes with normal activities (e.g., work or school) or awakens from sleep, limping    -  SEVERE (8-10): excruciating pain, unable to do any normal activities, unable to walk     moderate 4. WORK OR EXERCISE: "Has there been any recent work or exercise that involved this part of the body?"      yes 5. CAUSE: "What do you think is causing the leg pain?"     Nerve pain 6. OTHER SYMPTOMS: "Do you have any other symptoms?" (e.g., chest pain, back pain, breathing difficulty, swelling, rash, fever, numbness, weakness)     numbeness lower leg and  weakness 7. PREGNANCY: "Is there any chance you are pregnant?" "When was your last menstrual period?"     n/a  Protocols used: LEG PAIN-A-AH

## 2017-10-23 ENCOUNTER — Telehealth: Payer: Self-pay | Admitting: Family Medicine

## 2017-10-23 NOTE — Telephone Encounter (Signed)
-----   Message from Dorothyann Peng, NP sent at 10/23/2017 12:48 PM EDT ----- His Abdominal Aorta Ultrasound shows a small aortic aneurysm. His aneurysm is less than 3 cm, surgical intervention is not warranted until 5 cm. I always do think it is a good idea to be seen by Vascular so that we can keep an eye on the aneurysm.   His Echo was essentially normal.   He did have some enlargement of the left ventricle, this is common for someone with long standing history of hypertension.   Also noted was mild tricuspid regurgitation. This is a disorder in which this valve does not close tight enough. This is a benign finding.   Imaging will be scanned into his chart so cardiology can view the results

## 2017-10-23 NOTE — Telephone Encounter (Signed)
Pt notified of results by telephone.  States he has an appt with cardiology on 10/24/17.  Pt would like to know if his leg cramps could be due to his AAA due to lack of blood flow.  Please advise.  Pt will also discuss referral to vascular with cardiologist.

## 2017-10-24 ENCOUNTER — Other Ambulatory Visit: Payer: Self-pay | Admitting: Adult Health

## 2017-10-24 DIAGNOSIS — R6889 Other general symptoms and signs: Secondary | ICD-10-CM | POA: Diagnosis not present

## 2017-10-24 DIAGNOSIS — I2510 Atherosclerotic heart disease of native coronary artery without angina pectoris: Secondary | ICD-10-CM | POA: Diagnosis not present

## 2017-10-24 DIAGNOSIS — I10 Essential (primary) hypertension: Secondary | ICD-10-CM | POA: Diagnosis not present

## 2017-10-24 DIAGNOSIS — E7800 Pure hypercholesterolemia, unspecified: Secondary | ICD-10-CM | POA: Diagnosis not present

## 2017-10-24 NOTE — Telephone Encounter (Signed)
Left a message with Kevin Levine for a return call.

## 2017-10-24 NOTE — Telephone Encounter (Signed)
I do not think this cramps are due to his aneurysm. Aneurysms of this size do not normally have symptoms

## 2017-10-29 NOTE — Telephone Encounter (Signed)
Spoke to the pt and informed him that Kevin Levine did not think the leg pain was due to the aneurysm.  Pt stated he has seen Dr. Nadyne Coombes (cardiologist) and was told the pain is due to low circulation and perhaps a lowered oxygen level at night.  He will continue to follow up with cardiology.

## 2017-11-01 ENCOUNTER — Encounter: Payer: Self-pay | Admitting: Family Medicine

## 2017-11-01 DIAGNOSIS — E7800 Pure hypercholesterolemia, unspecified: Secondary | ICD-10-CM | POA: Insufficient documentation

## 2017-11-01 DIAGNOSIS — I714 Abdominal aortic aneurysm, without rupture, unspecified: Secondary | ICD-10-CM

## 2017-11-01 DIAGNOSIS — I739 Peripheral vascular disease, unspecified: Secondary | ICD-10-CM | POA: Insufficient documentation

## 2017-11-01 DIAGNOSIS — I2510 Atherosclerotic heart disease of native coronary artery without angina pectoris: Secondary | ICD-10-CM | POA: Insufficient documentation

## 2017-11-01 DIAGNOSIS — R351 Nocturia: Secondary | ICD-10-CM

## 2017-11-01 DIAGNOSIS — N401 Enlarged prostate with lower urinary tract symptoms: Secondary | ICD-10-CM

## 2017-11-26 ENCOUNTER — Ambulatory Visit: Payer: Medicare Other | Admitting: Adult Health

## 2017-12-27 DIAGNOSIS — E7800 Pure hypercholesterolemia, unspecified: Secondary | ICD-10-CM | POA: Diagnosis not present

## 2017-12-27 DIAGNOSIS — I2510 Atherosclerotic heart disease of native coronary artery without angina pectoris: Secondary | ICD-10-CM | POA: Diagnosis not present

## 2018-01-06 DIAGNOSIS — R600 Localized edema: Secondary | ICD-10-CM | POA: Diagnosis not present

## 2018-01-06 DIAGNOSIS — I2510 Atherosclerotic heart disease of native coronary artery without angina pectoris: Secondary | ICD-10-CM | POA: Diagnosis not present

## 2018-01-06 DIAGNOSIS — E7800 Pure hypercholesterolemia, unspecified: Secondary | ICD-10-CM | POA: Diagnosis not present

## 2018-01-06 DIAGNOSIS — I10 Essential (primary) hypertension: Secondary | ICD-10-CM | POA: Diagnosis not present

## 2018-02-04 ENCOUNTER — Ambulatory Visit
Admission: RE | Admit: 2018-02-04 | Discharge: 2018-02-04 | Disposition: A | Discharge status: Home / Self Care | Payer: Medicare Other | Source: Ambulatory Visit | Attending: Internal Medicine | Admitting: Internal Medicine

## 2018-02-04 ENCOUNTER — Other Ambulatory Visit: Payer: Self-pay | Admitting: Internal Medicine

## 2018-02-04 DIAGNOSIS — M179 Osteoarthritis of knee, unspecified: Secondary | ICD-10-CM | POA: Diagnosis not present

## 2018-02-04 DIAGNOSIS — Z1389 Encounter for screening for other disorder: Secondary | ICD-10-CM | POA: Diagnosis not present

## 2018-02-04 DIAGNOSIS — I10 Essential (primary) hypertension: Secondary | ICD-10-CM | POA: Diagnosis not present

## 2018-02-04 DIAGNOSIS — R0981 Nasal congestion: Secondary | ICD-10-CM | POA: Diagnosis not present

## 2018-02-04 DIAGNOSIS — E7800 Pure hypercholesterolemia, unspecified: Secondary | ICD-10-CM | POA: Diagnosis not present

## 2018-02-04 DIAGNOSIS — I2510 Atherosclerotic heart disease of native coronary artery without angina pectoris: Secondary | ICD-10-CM | POA: Diagnosis not present

## 2018-02-04 DIAGNOSIS — N183 Chronic kidney disease, stage 3 (moderate): Secondary | ICD-10-CM | POA: Diagnosis not present

## 2018-02-04 IMAGING — CR DG SINUSES COMPLETE 3+V
3 series · 3 of 3 positions shown · non-contrast
Comparison: None.

CLINICAL DATA: Congestion

EXAM:
PARANASAL SINUSES - COMPLETE 3 + VIEW

[w waters]
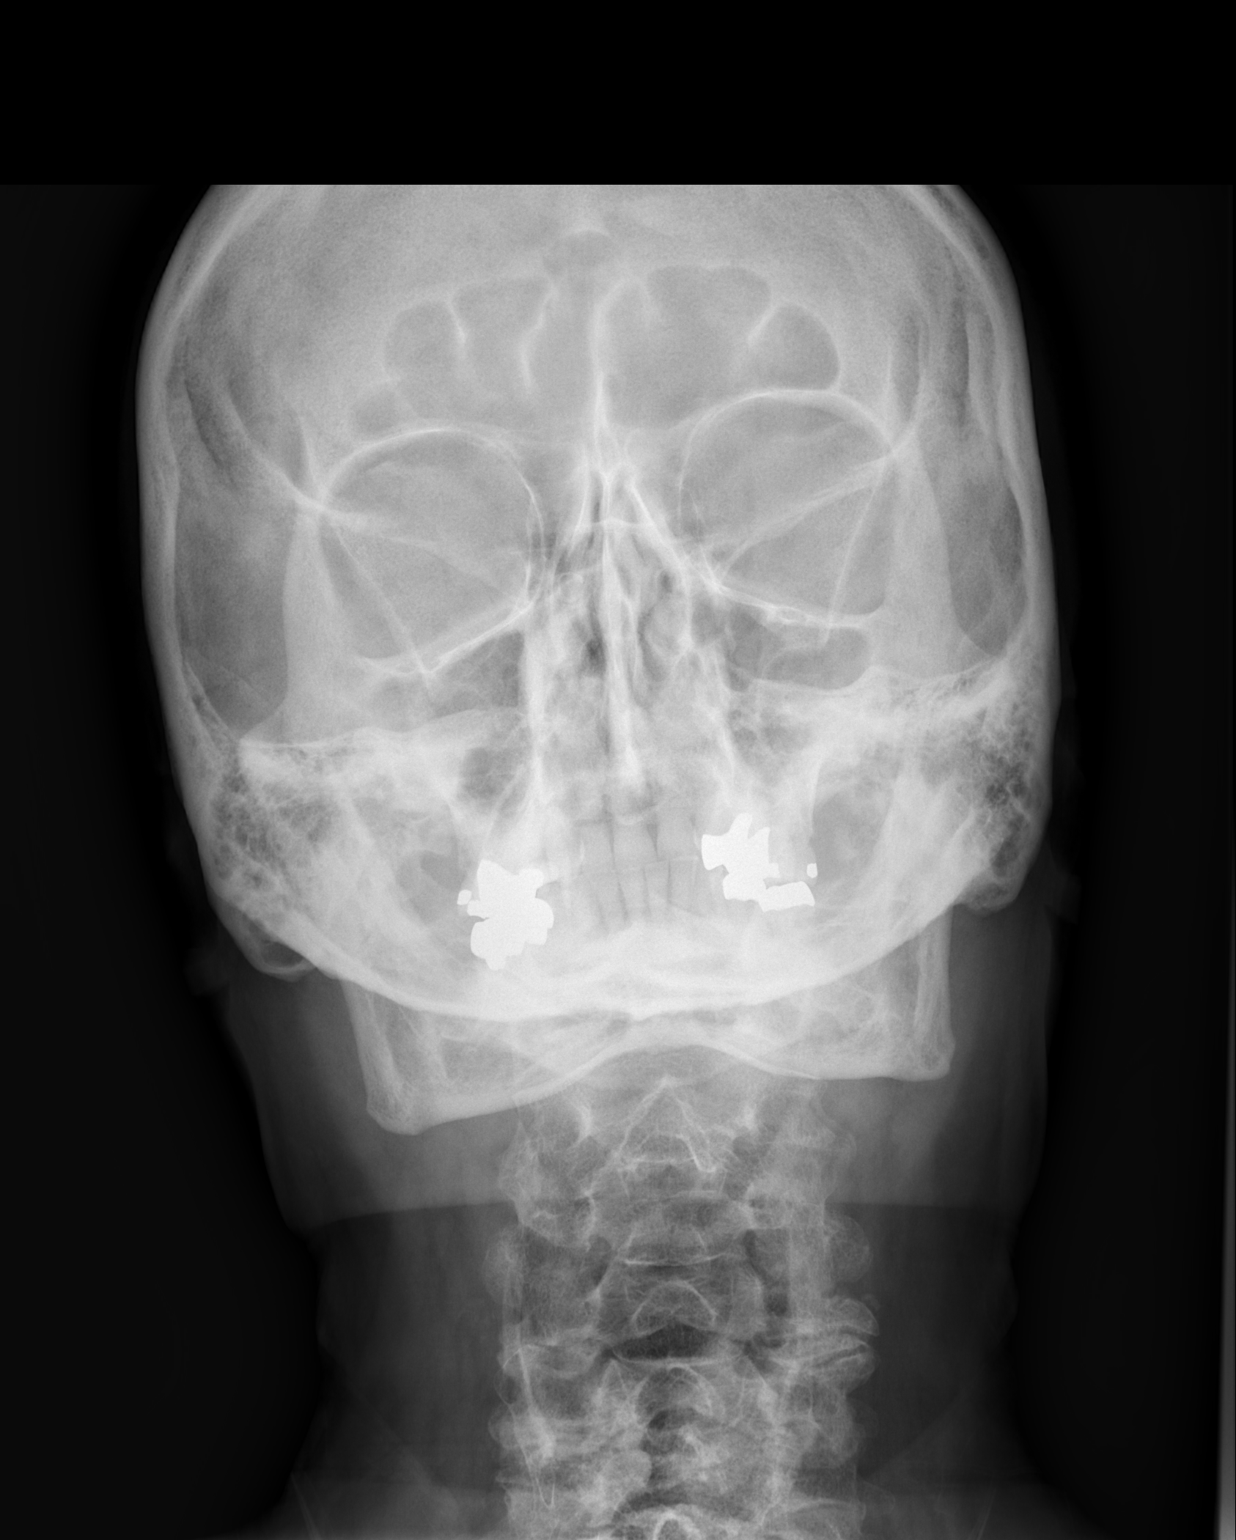

[[person_name]]
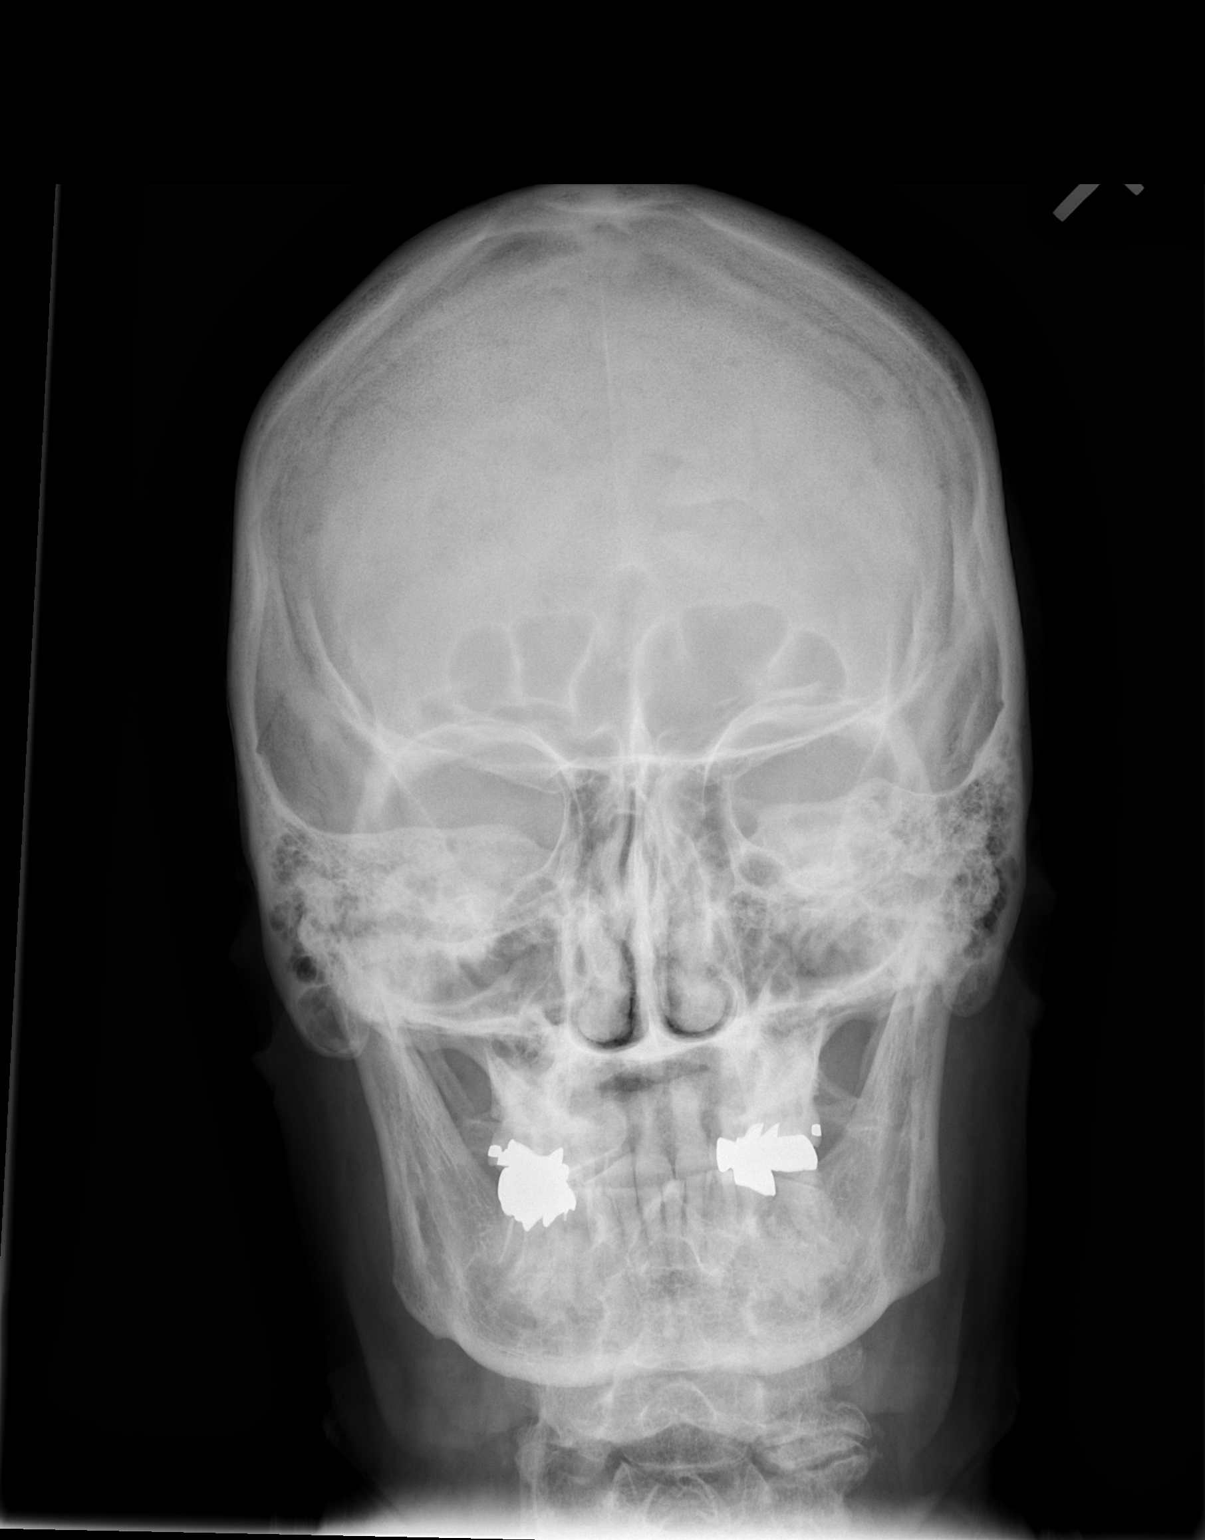

[w skull lat]
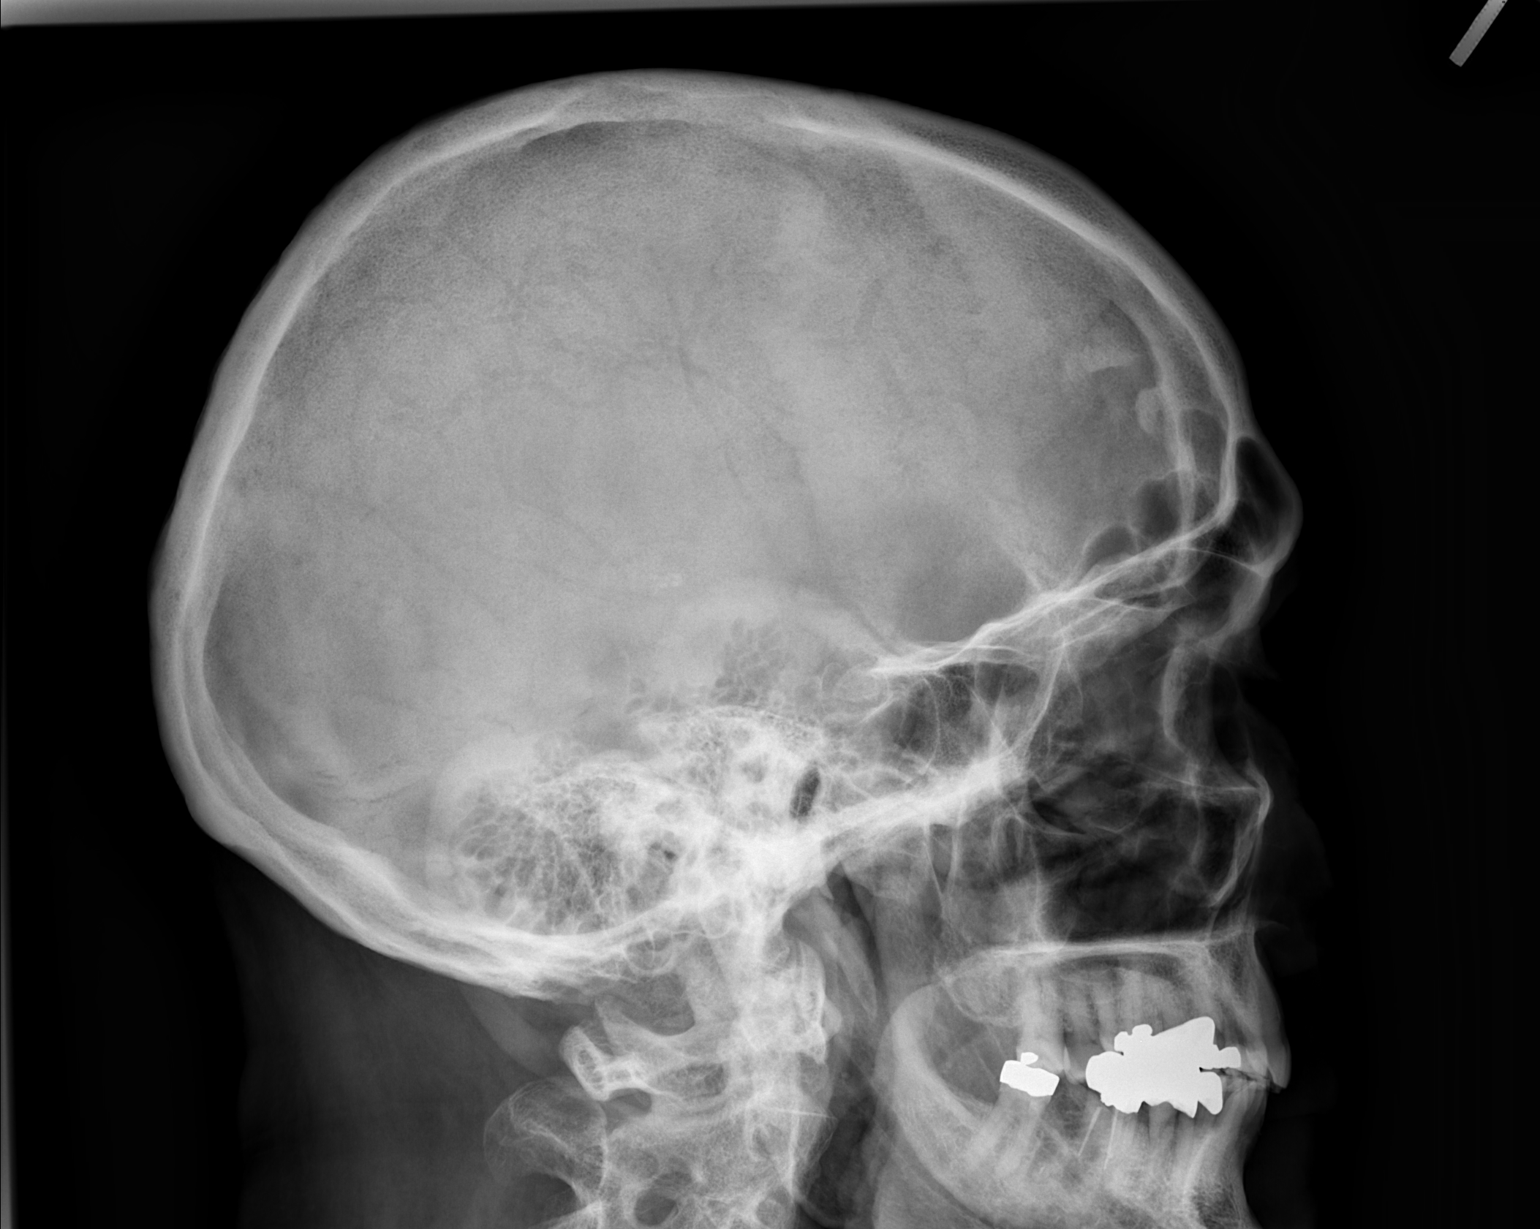

[3 of 3 positions shown; findings below may reference images not displayed]

FINDINGS: The paranasal sinus are aerated. There is no evidence of sinus
opacification air-fluid levels or mucosal thickening. No significant
bone abnormalities are seen.
IMPRESSION: Negative.

## 2018-03-24 DIAGNOSIS — M109 Gout, unspecified: Secondary | ICD-10-CM | POA: Diagnosis not present

## 2018-03-24 DIAGNOSIS — Z23 Encounter for immunization: Secondary | ICD-10-CM | POA: Diagnosis not present

## 2018-03-24 DIAGNOSIS — I10 Essential (primary) hypertension: Secondary | ICD-10-CM | POA: Diagnosis not present

## 2018-03-24 DIAGNOSIS — G4700 Insomnia, unspecified: Secondary | ICD-10-CM | POA: Diagnosis not present

## 2018-03-24 DIAGNOSIS — Z1389 Encounter for screening for other disorder: Secondary | ICD-10-CM | POA: Diagnosis not present

## 2018-03-24 DIAGNOSIS — I714 Abdominal aortic aneurysm, without rupture: Secondary | ICD-10-CM | POA: Diagnosis not present

## 2018-03-24 DIAGNOSIS — Z0000 Encounter for general adult medical examination without abnormal findings: Secondary | ICD-10-CM | POA: Diagnosis not present

## 2018-03-24 DIAGNOSIS — E7800 Pure hypercholesterolemia, unspecified: Secondary | ICD-10-CM | POA: Diagnosis not present

## 2018-03-24 DIAGNOSIS — M179 Osteoarthritis of knee, unspecified: Secondary | ICD-10-CM | POA: Diagnosis not present

## 2018-03-24 DIAGNOSIS — R7309 Other abnormal glucose: Secondary | ICD-10-CM | POA: Diagnosis not present

## 2018-03-24 DIAGNOSIS — N183 Chronic kidney disease, stage 3 (moderate): Secondary | ICD-10-CM | POA: Diagnosis not present

## 2018-03-24 DIAGNOSIS — I2510 Atherosclerotic heart disease of native coronary artery without angina pectoris: Secondary | ICD-10-CM | POA: Diagnosis not present

## 2018-06-04 DIAGNOSIS — K5730 Diverticulosis of large intestine without perforation or abscess without bleeding: Secondary | ICD-10-CM | POA: Diagnosis not present

## 2018-06-04 DIAGNOSIS — Z86010 Personal history of colonic polyps: Secondary | ICD-10-CM | POA: Diagnosis not present

## 2018-06-04 DIAGNOSIS — D122 Benign neoplasm of ascending colon: Secondary | ICD-10-CM | POA: Diagnosis not present

## 2018-06-04 DIAGNOSIS — D125 Benign neoplasm of sigmoid colon: Secondary | ICD-10-CM | POA: Diagnosis not present

## 2018-06-04 DIAGNOSIS — D123 Benign neoplasm of transverse colon: Secondary | ICD-10-CM | POA: Diagnosis not present

## 2018-06-04 DIAGNOSIS — K648 Other hemorrhoids: Secondary | ICD-10-CM | POA: Diagnosis not present

## 2018-06-04 DIAGNOSIS — D120 Benign neoplasm of cecum: Secondary | ICD-10-CM | POA: Diagnosis not present

## 2018-06-04 DIAGNOSIS — D124 Benign neoplasm of descending colon: Secondary | ICD-10-CM | POA: Diagnosis not present

## 2018-06-04 DIAGNOSIS — K635 Polyp of colon: Secondary | ICD-10-CM | POA: Diagnosis not present

## 2018-06-06 DIAGNOSIS — D123 Benign neoplasm of transverse colon: Secondary | ICD-10-CM | POA: Diagnosis not present

## 2018-06-06 DIAGNOSIS — D124 Benign neoplasm of descending colon: Secondary | ICD-10-CM | POA: Diagnosis not present

## 2018-06-06 DIAGNOSIS — D125 Benign neoplasm of sigmoid colon: Secondary | ICD-10-CM | POA: Diagnosis not present

## 2018-06-06 DIAGNOSIS — D120 Benign neoplasm of cecum: Secondary | ICD-10-CM | POA: Diagnosis not present

## 2018-06-06 DIAGNOSIS — D122 Benign neoplasm of ascending colon: Secondary | ICD-10-CM | POA: Diagnosis not present

## 2018-06-06 DIAGNOSIS — K635 Polyp of colon: Secondary | ICD-10-CM | POA: Diagnosis not present

## 2018-06-27 DIAGNOSIS — I10 Essential (primary) hypertension: Secondary | ICD-10-CM | POA: Diagnosis not present

## 2018-06-27 DIAGNOSIS — R600 Localized edema: Secondary | ICD-10-CM | POA: Diagnosis not present

## 2018-06-27 DIAGNOSIS — E7800 Pure hypercholesterolemia, unspecified: Secondary | ICD-10-CM | POA: Diagnosis not present

## 2018-06-27 DIAGNOSIS — I2510 Atherosclerotic heart disease of native coronary artery without angina pectoris: Secondary | ICD-10-CM | POA: Diagnosis not present

## 2018-07-08 DIAGNOSIS — R600 Localized edema: Secondary | ICD-10-CM | POA: Diagnosis not present

## 2018-07-08 DIAGNOSIS — L0292 Furuncle, unspecified: Secondary | ICD-10-CM | POA: Diagnosis not present

## 2018-07-08 DIAGNOSIS — R499 Unspecified voice and resonance disorder: Secondary | ICD-10-CM | POA: Diagnosis not present

## 2018-07-09 DIAGNOSIS — R499 Unspecified voice and resonance disorder: Secondary | ICD-10-CM | POA: Insufficient documentation

## 2018-07-09 DIAGNOSIS — J370 Chronic laryngitis: Secondary | ICD-10-CM | POA: Diagnosis not present

## 2018-07-09 DIAGNOSIS — Z7289 Other problems related to lifestyle: Secondary | ICD-10-CM | POA: Diagnosis not present

## 2018-12-01 DIAGNOSIS — G4700 Insomnia, unspecified: Secondary | ICD-10-CM | POA: Diagnosis not present

## 2018-12-01 DIAGNOSIS — M25559 Pain in unspecified hip: Secondary | ICD-10-CM | POA: Diagnosis not present

## 2018-12-01 DIAGNOSIS — R0981 Nasal congestion: Secondary | ICD-10-CM | POA: Diagnosis not present

## 2018-12-01 DIAGNOSIS — I10 Essential (primary) hypertension: Secondary | ICD-10-CM | POA: Diagnosis not present

## 2018-12-01 DIAGNOSIS — I2510 Atherosclerotic heart disease of native coronary artery without angina pectoris: Secondary | ICD-10-CM | POA: Diagnosis not present

## 2018-12-01 DIAGNOSIS — M109 Gout, unspecified: Secondary | ICD-10-CM | POA: Diagnosis not present

## 2018-12-01 DIAGNOSIS — I714 Abdominal aortic aneurysm, without rupture: Secondary | ICD-10-CM | POA: Diagnosis not present

## 2018-12-01 DIAGNOSIS — N183 Chronic kidney disease, stage 3 (moderate): Secondary | ICD-10-CM | POA: Diagnosis not present

## 2018-12-01 DIAGNOSIS — R7303 Prediabetes: Secondary | ICD-10-CM | POA: Diagnosis not present

## 2018-12-01 DIAGNOSIS — E7800 Pure hypercholesterolemia, unspecified: Secondary | ICD-10-CM | POA: Diagnosis not present

## 2018-12-25 DIAGNOSIS — H04123 Dry eye syndrome of bilateral lacrimal glands: Secondary | ICD-10-CM | POA: Diagnosis not present

## 2018-12-25 DIAGNOSIS — Z961 Presence of intraocular lens: Secondary | ICD-10-CM | POA: Diagnosis not present

## 2018-12-31 ENCOUNTER — Ambulatory Visit: Payer: Self-pay | Admitting: Cardiology

## 2019-03-10 ENCOUNTER — Other Ambulatory Visit: Payer: Self-pay

## 2019-03-10 ENCOUNTER — Ambulatory Visit (INDEPENDENT_AMBULATORY_CARE_PROVIDER_SITE_OTHER): Payer: Medicare Other | Admitting: Cardiology

## 2019-03-10 ENCOUNTER — Encounter: Payer: Self-pay | Admitting: Cardiology

## 2019-03-10 VITALS — BP 131/71 | HR 65 | Temp 97.8°F | Ht 71.0 in | Wt 212.0 lb

## 2019-03-10 DIAGNOSIS — I25119 Atherosclerotic heart disease of native coronary artery with unspecified angina pectoris: Secondary | ICD-10-CM

## 2019-03-10 DIAGNOSIS — I10 Essential (primary) hypertension: Secondary | ICD-10-CM

## 2019-03-10 DIAGNOSIS — R400 Somnolence: Secondary | ICD-10-CM

## 2019-03-10 DIAGNOSIS — R5383 Other fatigue: Secondary | ICD-10-CM

## 2019-03-10 DIAGNOSIS — I714 Abdominal aortic aneurysm, without rupture, unspecified: Secondary | ICD-10-CM

## 2019-03-10 DIAGNOSIS — R0609 Other forms of dyspnea: Secondary | ICD-10-CM | POA: Diagnosis not present

## 2019-03-10 DIAGNOSIS — R5381 Other malaise: Secondary | ICD-10-CM

## 2019-03-10 NOTE — Progress Notes (Signed)
Primary Physician/Referring:  [name], MD  Patient ID: Kevin Levine, male    DOB: 10/26/1931, 85 y.o.   MRN: [id number]  Chief Complaint  Patient presents with  . Coronary Artery Disease  . Results    lab  . Follow-up    6 month   HPI:    Kevin Levine  is a 85 y.o. Caucasian male with CAD status post balloon angioplasty of left circumflex and stenting to distal left main and the proximal LAD in September 2016, hypertension, hyperlipidemia, stable stage 3 chronic kidney disease, and gout. He has chronic bilateral leg edema due to varicose veins, has been wearing support stockings on the right leg only. Also found to have mild dilatation of the abdominal aorta by duplex.  He made an appointment to see me for daytime somnolence, fatigue and decreased exercise tolerance.  Denies chest pain, palpitations, dizziness or syncope.  He wanted to make sure that it is not cardiac etiology for his symptoms.  He also states that since Covid 19, he is been depressed.  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  Past Medical History:  Diagnosis Date  . Arthritis   . Chicken pox   . Colon polyps   . Coronary artery disease   . ED (erectile dysfunction)   . Essential hypertension   . Gout   . Heart disease   . Hyperlipidemia   . Kidney disease   . Seasonal allergies    Past Surgical History:  Procedure Laterality Date  . APPENDECTOMY    . CATARACT EXTRACTION Bilateral   . CORONARY ANGIOPLASTY WITH STENT PLACEMENT  2016  . Status post ballon angioplasty of the left circumflex and drug eluting stent to the distal left main into the proximal LAD   03/14/2015   Social History   Socioeconomic History  . Marital status: Married    Spouse name: Not on file  . Number of children: 3  . Years of education: Not on file  . Highest education level: Not on file  Occupational History  . Not on file  Social Needs  . Financial resource strain: Not on file  .  Food insecurity    Worry: Not on file    Inability: Not on file  . Transportation needs    Medical: Not on file    Non-medical: Not on file  Tobacco Use  . Smoking status: Never Smoker  . Smokeless tobacco: Never Used  Substance and Sexual Activity  . Alcohol use: Yes    Alcohol/week: 21.0 standard drinks    Types: 14 Glasses of wine, 7 Standard drinks or equivalent per week  . Drug use: No  . Sexual activity: Not on file  Lifestyle  . Physical activity    Days per week: Not on file    Minutes per session: Not on file  . Stress: Not on file  Relationships  . Social Herbalist on phone: Not on file    Gets together: Not on file    Attends religious service: Not on file    Active member of club or organization: Not on file    Attends meetings of clubs or organizations: Not on file    Relationship status: Not on file  . Intimate partner violence    Fear of current or ex partner: Not on file    Emotionally abused: Not on file    Physically abused: Not on file    Forced sexual activity:  Not on file  Other Topics Concern  . Not on file  Social History Narrative   Retired - works as an Herbalist.   ROS  Review of Systems  Constitution: Positive for malaise/fatigue. Negative for chills, decreased appetite and weight gain.  HENT: Positive for hoarse voice.   Cardiovascular: Positive for dyspnea on exertion. Negative for leg swelling and syncope.  Endocrine: Negative for cold intolerance.  Hematologic/Lymphatic: Does not bruise/bleed easily.  Musculoskeletal: Negative for joint swelling.  Gastrointestinal: Negative for abdominal pain, anorexia, change in bowel habit, hematochezia and melena.  Neurological: Negative for headaches and light-headedness.  Psychiatric/Behavioral: Negative for depression and substance abuse.  All other systems reviewed and are negative.  Objective   Vitals with BMI 03/10/2019 09/06/2017 05/23/2017  Height _0  _1  _2    Weight 212 lbs 201 lbs 207 lbs  BMI 29.58 87.56 43.32  Systolic 951 884 166  Diastolic 71 76 74  Pulse 65 87 -    Blood pressure 131/71, pulse 65, temperature 97.8 F (36.6 C), height _3  (1.803 m), weight 212 lb (96.2 kg), SpO2 97 %. Body mass index is 29.57 kg/m.   Physical Exam  Constitutional: He appears well-developed and well-nourished. No distress.  HENT:  Head: Atraumatic.  Eyes: Conjunctivae are normal.  Neck: Neck supple. No JVD present. No thyromegaly present.  Cardiovascular: Normal rate, regular rhythm, normal heart sounds and intact distal pulses. Exam reveals no gallop.  No murmur heard. Varicose veins mild noted bilateral. 1+ bilateral pitting edema, no JVD.  Pulmonary/Chest: Effort normal and breath sounds normal.  Abdominal: Soft. Bowel sounds are normal.  Musculoskeletal: Normal range of motion.  Neurological: He is alert.  Skin: Skin is warm and dry.  Psychiatric: He has a normal mood and affect.   Radiology: No results found.  Laboratory examination:   Labs 12/28/2017: Total cholesterol 113, triglycerides 77, HDL 47, LDL 51 mg. Serum glucose 112 mg, BUN 22, creatinine 1.64, eGFR 38/44 mL, potassium 4.4. CMP otherwise normal.  05/23/2017: Cholesterol 180, triglycerides 152, HDL 44, LDL 105. TSH 2.6. CBC normal. Creatinine 1.48, potassium 4.3, EGFR of 48, BMP otherwise normal. Hepatic function panel normal.  No results for input(s): NA, K, CL, CO2, GLUCOSE, BUN, CREATININE, CALCIUM, GFRNONAA, GFRAA in the last 8760 hours. CMP Latest Ref Rng & Units 05/23/2017 05/23/2017 12/07/2016  Glucose 70 - 99 mg/dL 109(H) - -  BUN 6 - 23 mg/dL 25(H) - 23(A)  Creatinine 0.40 - 1.50 mg/dL 1.48 1.5(A) 1.5(A)  Sodium 135 - 145 mEq/L 138 - 137  Potassium 3.5 - 5.1 mEq/L 4.3 4.3 4.3  Chloride 96 - 112 mEq/L 103 - -  CO2 19 - 32 mEq/L 27 - -  Calcium 8.4 - 10.5 mg/dL 9.9 - -  Total Protein 6.0 - 8.3 g/dL 7.8 - -  Total Bilirubin 0.2 - 1.2 mg/dL 0.8 - -  Alkaline  Phos 39 - 117 U/L 68 - -  AST 0 - 37 U/L 19 - 21  ALT 0 - 53 U/L 16 - 17   CBC Latest Ref Rng & Units 05/23/2017  WBC 4.0 - 10.5 K/uL 7.4  Hemoglobin 13.0 - 17.0 g/dL 13.8  Hematocrit 39.0 - 52.0 % 41.7  Platelets 150.0 - 400.0 K/uL 274.0   Lipid Panel     Component Value Date/Time   CHOL 180 05/23/2017 1107   TRIG 152.0 (H) 05/23/2017 1107   HDL 44.60 05/23/2017 1107   CHOLHDL 4 05/23/2017 1107  VLDL 30.4 05/23/2017 1107   LDLCALC 105 (H) 05/23/2017 1107   HEMOGLOBIN A1C No results found for: HGBA1C, MPG TSH No results for input(s): TSH in the last 8760 hours. Medications   Prior to Admission medications   Medication Sig Start Date End Date Taking? Authorizing Provider  allopurinol (ZYLOPRIM) 100 MG tablet Take 1 tablet (100 mg total) by mouth daily. 07/09/17  Yes Nafziger, Tommi Rumps, NP  amLODipine (NORVASC) 5 MG tablet Take 1 tablet (5 mg total) by mouth daily. 07/09/17  Yes Nafziger, Tommi Rumps, NP  atorvastatin (LIPITOR) 80 MG tablet Take 1 tablet (80 mg total) by mouth daily. 07/09/17  Yes Nafziger, Tommi Rumps, NP  doxazosin (CARDURA) 2 MG tablet Take 2 mg by mouth daily.   Yes [provider]  ezetimibe (ZETIA) 10 MG tablet TK 1 T PO QPM AFTER DINNER 09/19/17  Yes [provider]  losartan (COZAAR) 100 MG tablet Take 1 tablet (100 mg total) by mouth daily. 10/01/17  Yes Nafziger, Tommi Rumps, NP  metoprolol succinate (TOPROL-XL) 25 MG 24 hr tablet TK 1 T PO D 09/19/17  Yes [provider]  nitroGLYCERIN (NITROSTAT) 0.4 MG SL tablet PLACE 1 T UNT AND LET IT DIS Q 5 MIN PRF CP 09/19/17  Yes [provider]     Current Outpatient Medications  Medication Instructions  . allopurinol (ZYLOPRIM) 100 mg, Oral, Daily  . amLODipine (NORVASC) 5 mg, Oral, Daily  . atorvastatin (LIPITOR) 80 mg, Oral, Daily  . doxazosin (CARDURA) 2 mg, Oral, Daily  . ezetimibe (ZETIA) 10 MG tablet TK 1 T PO QPM AFTER DINNER  . losartan (COZAAR) 100 mg, Oral, Daily  . metoprolol succinate  (TOPROL-XL) 25 MG 24 hr tablet TK 1 T PO D  . nitroGLYCERIN (NITROSTAT) 0.4 MG SL tablet PLACE 1 T UNT AND LET IT DIS Q 5 MIN PRF CP    Cardiac Studies:   Coronary Angiography 11/01/2014: S/P resolute DES 3.5 x 18 mm stent into the distal left main into the LAD and balloon angioplasty to circumflex arteon   Echocardiogram 10/16/2017: Left ventricle cavity is normal in size. Mild concentric hypertrophy of the left ventricle. Normal global wall motion. Normal diastolic filling pattern. Calculated EF 55%. Mild tricuspid regurgitation. No evidence of pulmonary hypertension.  Exercise myoview stress 10/07/2017: 1. The patient performed treadmill exercise using a Bruce protocol, completing 4:59 minutes. The patient completed an estimated workload of 7.03 METS, reaching 86% of the maximum predicted heart rate. No stress symptoms reported. Exercise capacity is fair. Normal hemodynamic response. Stress electrocardiogram showed no ischemic changes. 2. The overall quality of the study is excellent. There is no evidence of abnormal lung activity. Stress and rest SPECT images demonstrate homogeneous tracer distribution throughout the myocardium. Gated SPECT imaging reveals normal myocardial thickening and wall motion. The left ventricular ejection fraction was normal (53%).  3. Low risk study.  Abdominal aortic duplex 10/16/2017: Mild diffuse calcific plaque noted in abdominal aorta. An abdominal aortic aneurysm measuring 2.85 x 2.82 x 2.73 cm is seen in mid aorta. There is mild ectasia of the abdominal aorta. The bilateral iliac arteries are also dilated at 15 mm. Normal velocity. Recheck in 2 years.  Assessment     ICD-10-CM   1. Atherosclerosis of native coronary artery of native heart with angina pectoris ([place])  I25.119 EKG 12-Lead  2. Abdominal aortic aneurysm (AAA) without rupture ([facility])  I71.4   3. Primary hypertension  I10   4. Malaise and fatigue  R53.81 Pulse oximetry, overnight  R53.83    5. Dyspnea on exertion  R06.09 Pulse oximetry, overnight  6. Daytime somnolence  R40.0     EKG 03/10/2019: Normal sinus rhythm at rate of 67 bpm, left atrial enlargement, left axis deviation, left anterior fascicular block.  IVCD, borderline criteria for LVH.  Nonspecific T abnormality.  1 PAC. No significant change from  EKG 06/27/2018.   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  No chest pain, still continues to remain active.  He clearly endorses depression since outbreak of coronary virus  pandemic.  The suspicion is he may have significant nocturnal hypoxemia or he may be developing sleep apnea as he feels significant daytime somnolence as well along with dyspnea and decreased exercise tolerance.  I have recommended nocturnal oximetry to evaluate for the same. He is on appropriate medical therapy including statins.  With regard to coronary artery disease, hypertension, chronic stage III kidney disease, he is remained stable.  No changes in the medications were done today.  I'll see him back in 6 months.  I will continue to monitor his small abdominal aortic aneurysm we surveillance Dopplers.  Adrian Prows, MD, [facility] 03/10/2019, 4:50 PM [facility] [place] Pager: [phone] Office: [phone] If no answer Cell [phone]

## 2019-03-13 DIAGNOSIS — R0609 Other forms of dyspnea: Secondary | ICD-10-CM | POA: Diagnosis not present

## 2019-03-30 DIAGNOSIS — E7800 Pure hypercholesterolemia, unspecified: Secondary | ICD-10-CM | POA: Diagnosis not present

## 2019-03-30 DIAGNOSIS — R7303 Prediabetes: Secondary | ICD-10-CM | POA: Diagnosis not present

## 2019-03-30 DIAGNOSIS — Z0000 Encounter for general adult medical examination without abnormal findings: Secondary | ICD-10-CM | POA: Diagnosis not present

## 2019-03-30 DIAGNOSIS — R7309 Other abnormal glucose: Secondary | ICD-10-CM | POA: Diagnosis not present

## 2019-03-30 DIAGNOSIS — M109 Gout, unspecified: Secondary | ICD-10-CM | POA: Diagnosis not present

## 2019-03-30 DIAGNOSIS — I10 Essential (primary) hypertension: Secondary | ICD-10-CM | POA: Diagnosis not present

## 2019-03-30 DIAGNOSIS — M179 Osteoarthritis of knee, unspecified: Secondary | ICD-10-CM | POA: Diagnosis not present

## 2019-03-30 DIAGNOSIS — Z23 Encounter for immunization: Secondary | ICD-10-CM | POA: Diagnosis not present

## 2019-03-30 DIAGNOSIS — N1831 Chronic kidney disease, stage 3a: Secondary | ICD-10-CM | POA: Diagnosis not present

## 2019-03-30 DIAGNOSIS — I2510 Atherosclerotic heart disease of native coronary artery without angina pectoris: Secondary | ICD-10-CM | POA: Diagnosis not present

## 2019-03-30 DIAGNOSIS — Z1389 Encounter for screening for other disorder: Secondary | ICD-10-CM | POA: Diagnosis not present

## 2019-04-13 ENCOUNTER — Encounter: Payer: Self-pay | Admitting: Orthopedic Surgery

## 2019-04-13 ENCOUNTER — Ambulatory Visit (INDEPENDENT_AMBULATORY_CARE_PROVIDER_SITE_OTHER): Payer: Medicare Other | Admitting: Orthopedic Surgery

## 2019-04-13 ENCOUNTER — Other Ambulatory Visit: Payer: Self-pay

## 2019-04-13 DIAGNOSIS — M1711 Unilateral primary osteoarthritis, right knee: Secondary | ICD-10-CM

## 2019-04-13 DIAGNOSIS — I25119 Atherosclerotic heart disease of native coronary artery with unspecified angina pectoris: Secondary | ICD-10-CM | POA: Diagnosis not present

## 2019-04-13 MED ORDER — BUPIVACAINE HCL 0.25 % IJ SOLN
4.0000 mL | INTRAMUSCULAR | Status: AC | PRN
  Administered 2019-04-13: 4 mL via INTRA_ARTICULAR

## 2019-04-13 MED ORDER — METHYLPREDNISOLONE ACETATE 40 MG/ML IJ SUSP
40.0000 mg | INTRAMUSCULAR | Status: AC | PRN
  Administered 2019-04-13: 40 mg via INTRA_ARTICULAR

## 2019-04-13 MED ORDER — LIDOCAINE HCL 1 % IJ SOLN
5.0000 mL | INTRAMUSCULAR | Status: AC | PRN
  Administered 2019-04-13: 5 mL

## 2019-04-13 NOTE — Progress Notes (Addendum)
Office Visit Note   Patient: Kevin Levine           Date of Birth: April 25, 1932           MRN: [id number] Visit Date: 04/13/2019 Requested by: [name], MD [address] [place],  [place] [id number] PCP: [name], MD  Subjective: Chief Complaint  Patient presents with  . Right Knee - Pain    HPI: Kevin Levine is a patient with right knee arthritis.  He has had chronic pain on and off for 20 years.  Last had an injection over a year ago and did well.  Walks about three fourths of a mile per day.  Does not really want surgery.  Although he is 66 he is younger appearing.  Denies any mechanical symptoms in the knee.              ROS: All systems reviewed are negative as they relate to the chief complaint within the history of present illness.  Patient denies  fevers or chills.   Assessment & Plan: Visit Diagnoses:  1. Unilateral primary osteoarthritis, right knee     Plan: Impression is gradually progressive right knee arthritis.  Plan is right knee cortisone injection.  Given good relief before.  Continue with nonloadbearing quad strengthening exercises.  No effusion today means that the walk he is doing I do think is aggravating his need to terribly much.  I would favor continuing to do that along with strengthening exercises.  Follow-up as needed  Follow-Up Instructions: Return if symptoms worsen or fail to improve.   Orders:  No orders of the defined types were placed in this encounter.  No orders of the defined types were placed in this encounter.     Procedures: Large Joint Inj: R knee on 04/13/2019 10:05 PM Indications: diagnostic evaluation, joint swelling and pain Details: 18 G 1.5 in needle, superolateral approach  Arthrogram: No  Medications: 5 mL lidocaine 1 %; 40 mg methylPREDNISolone acetate 40 MG/ML; 4 mL bupivacaine 0.25 % Outcome: tolerated well, no immediate complications Procedure, treatment alternatives, risks and benefits explained,  specific risks discussed. Consent was given by the patient. Immediately prior to procedure a time out was called to verify the correct patient, procedure, equipment, support staff and site/side marked as required. Patient was prepped and draped in the usual sterile fashion.       Clinical Data: No additional findings.  Objective: Vital Signs: There were no vitals taken for this visit.  Physical Exam:   Constitutional: Patient appears well-developed HEENT:  Head: Normocephalic Eyes:EOM are normal Neck: Normal range of motion Cardiovascular: Normal rate Pulmonary/chest: Effort normal Neurologic: Patient is alert Skin: Skin is warm Psychiatric: Patient has normal mood and affect    Ortho Exam: Ortho exam demonstrates slight varus alignment right lower extremity with palpable pedal pulses.  Extensor mechanism is intact and nontender.  Has no effusion in the knee.  About 5 degrees shy of full extension with flexion to about 115.  Collaterals and cruciates are stable.  Specialty Comments:  No specialty comments available.  Imaging: No results found.   PMFS History: Patient Active Problem List   Diagnosis Date Noted  . Claudication of lower extremity ([place]) 11/01/2017  . Aortic aneurysm, abdominal ([place]) 11/01/2017  . Hypercholesteremia 11/01/2017  . BPH associated with nocturia 11/01/2017  . Coronary atherosclerosis of native coronary artery 11/01/2017   Past Medical History:  Diagnosis Date  . Arthritis   . Chicken pox   .  Colon polyps   . Coronary artery disease   . ED (erectile dysfunction)   . Essential hypertension   . Gout   . Heart disease   . Hyperlipidemia   . Kidney disease   . Seasonal allergies     Family History  Problem Relation Age of Onset  . Lung cancer Mother   . Aneurysm Brother   . Cancer Paternal Grandfather   . Heart attack Maternal Grandfather     Past Surgical History:  Procedure Laterality Date  . APPENDECTOMY    . CATARACT  EXTRACTION Bilateral   . CORONARY ANGIOPLASTY WITH STENT PLACEMENT  2016  . Status post ballon angioplasty of the left circumflex and drug eluting stent to the distal left main into the proximal LAD   03/14/2015   Social History   Occupational History  . Not on file  Tobacco Use  . Smoking status: Never Smoker  . Smokeless tobacco: Never Used  Substance and Sexual Activity  . Alcohol use: Yes    Alcohol/week: 21.0 standard drinks    Types: 14 Glasses of wine, 7 Standard drinks or equivalent per week  . Drug use: No  . Sexual activity: Not on file

## 2019-05-01 ENCOUNTER — Telehealth: Payer: Self-pay

## 2019-05-01 NOTE — Telephone Encounter (Signed)
Telephone encounter:  Reason for call: Patient wants to know if he can have a stress test scheduled> he worried about having another blockage and wants to be proactive in his health.   Usual provider: Einar Gip   Last office visit: 03/10/19  Next office visit: 09/21/19   Last hospitalization: N?A   Current Outpatient Medications on File Prior to Visit  Medication Sig Dispense Refill  . allopurinol (ZYLOPRIM) 100 MG tablet Take 1 tablet (100 mg total) by mouth daily. 90 tablet 3  . amLODipine (NORVASC) 5 MG tablet Take 1 tablet (5 mg total) by mouth daily. 90 tablet 3  . atorvastatin (LIPITOR) 80 MG tablet Take 1 tablet (80 mg total) by mouth daily. 90 tablet 3  . doxazosin (CARDURA) 2 MG tablet Take 2 mg by mouth daily.    Marland Kitchen ezetimibe (ZETIA) 10 MG tablet TK 1 T PO QPM AFTER DINNER  1  . losartan (COZAAR) 100 MG tablet Take 1 tablet (100 mg total) by mouth daily. 90 tablet 1  . metoprolol succinate (TOPROL-XL) 25 MG 24 hr tablet TK 1 T PO D  1  . nitroGLYCERIN (NITROSTAT) 0.4 MG SL tablet PLACE 1 T UNT AND LET IT DIS Q 5 MIN PRF CP  4   No current facility-administered medications on file prior to visit.

## 2019-05-02 NOTE — Telephone Encounter (Signed)
He has had a normal stress test a year ago. I am happy to see him in the office and then decide. JG

## 2019-05-06 ENCOUNTER — Telehealth: Payer: Self-pay

## 2019-05-06 NOTE — Telephone Encounter (Signed)
Spoke with patient and relayed message from Dr. Einar Gip sent to medical assistant.  He does not want to schedule a visit at this time as wants to speak with [facility] where he was treated about recommended protocal for after treatment.

## 2019-05-15 ENCOUNTER — Other Ambulatory Visit: Payer: Self-pay

## 2019-06-22 ENCOUNTER — Other Ambulatory Visit: Payer: Self-pay

## 2019-06-22 MED ORDER — DOXAZOSIN MESYLATE 2 MG PO TABS: 2.0000 mg | ORAL_TABLET | Freq: Every day | ORAL | 3 refills | Status: DC

## 2019-09-14 ENCOUNTER — Ambulatory Visit: Payer: TRICARE For Life (TFL) | Admitting: Cardiology

## 2019-09-21 ENCOUNTER — Ambulatory Visit: Payer: TRICARE For Life (TFL) | Admitting: Cardiology

## 2019-10-05 ENCOUNTER — Other Ambulatory Visit: Payer: Self-pay | Admitting: General Surgery

## 2019-10-05 DIAGNOSIS — R197 Diarrhea, unspecified: Secondary | ICD-10-CM

## 2019-10-08 ENCOUNTER — Ambulatory Visit
Admission: RE | Admit: 2019-10-08 | Discharge: 2019-10-08 | Disposition: A | Discharge status: Home / Self Care | Payer: Medicare Other | Source: Ambulatory Visit | Attending: Internal Medicine | Admitting: Internal Medicine

## 2019-10-08 ENCOUNTER — Other Ambulatory Visit: Payer: Self-pay | Admitting: Internal Medicine

## 2019-10-08 DIAGNOSIS — M179 Osteoarthritis of knee, unspecified: Secondary | ICD-10-CM | POA: Diagnosis not present

## 2019-10-08 DIAGNOSIS — M79606 Pain in leg, unspecified: Secondary | ICD-10-CM | POA: Diagnosis not present

## 2019-10-08 DIAGNOSIS — R52 Pain, unspecified: Secondary | ICD-10-CM

## 2019-10-08 DIAGNOSIS — I2510 Atherosclerotic heart disease of native coronary artery without angina pectoris: Secondary | ICD-10-CM | POA: Diagnosis not present

## 2019-10-08 DIAGNOSIS — M25512 Pain in left shoulder: Secondary | ICD-10-CM | POA: Diagnosis not present

## 2019-10-08 DIAGNOSIS — M19012 Primary osteoarthritis, left shoulder: Secondary | ICD-10-CM | POA: Diagnosis not present

## 2019-10-08 DIAGNOSIS — N1831 Chronic kidney disease, stage 3a: Secondary | ICD-10-CM | POA: Diagnosis not present

## 2019-10-08 DIAGNOSIS — R7303 Prediabetes: Secondary | ICD-10-CM | POA: Diagnosis not present

## 2019-10-08 DIAGNOSIS — E7800 Pure hypercholesterolemia, unspecified: Secondary | ICD-10-CM | POA: Diagnosis not present

## 2019-10-08 DIAGNOSIS — I10 Essential (primary) hypertension: Secondary | ICD-10-CM | POA: Diagnosis not present

## 2019-10-08 IMAGING — DX DG SHOULDER 2+V*L*
2 series · 2 of 2 positions shown · non-contrast
Comparison: None.

CLINICAL DATA: Shoulder pain

EXAM:
LEFT SHOULDER - 2+ VIEW

[dg shoulder left (1 of 2)]
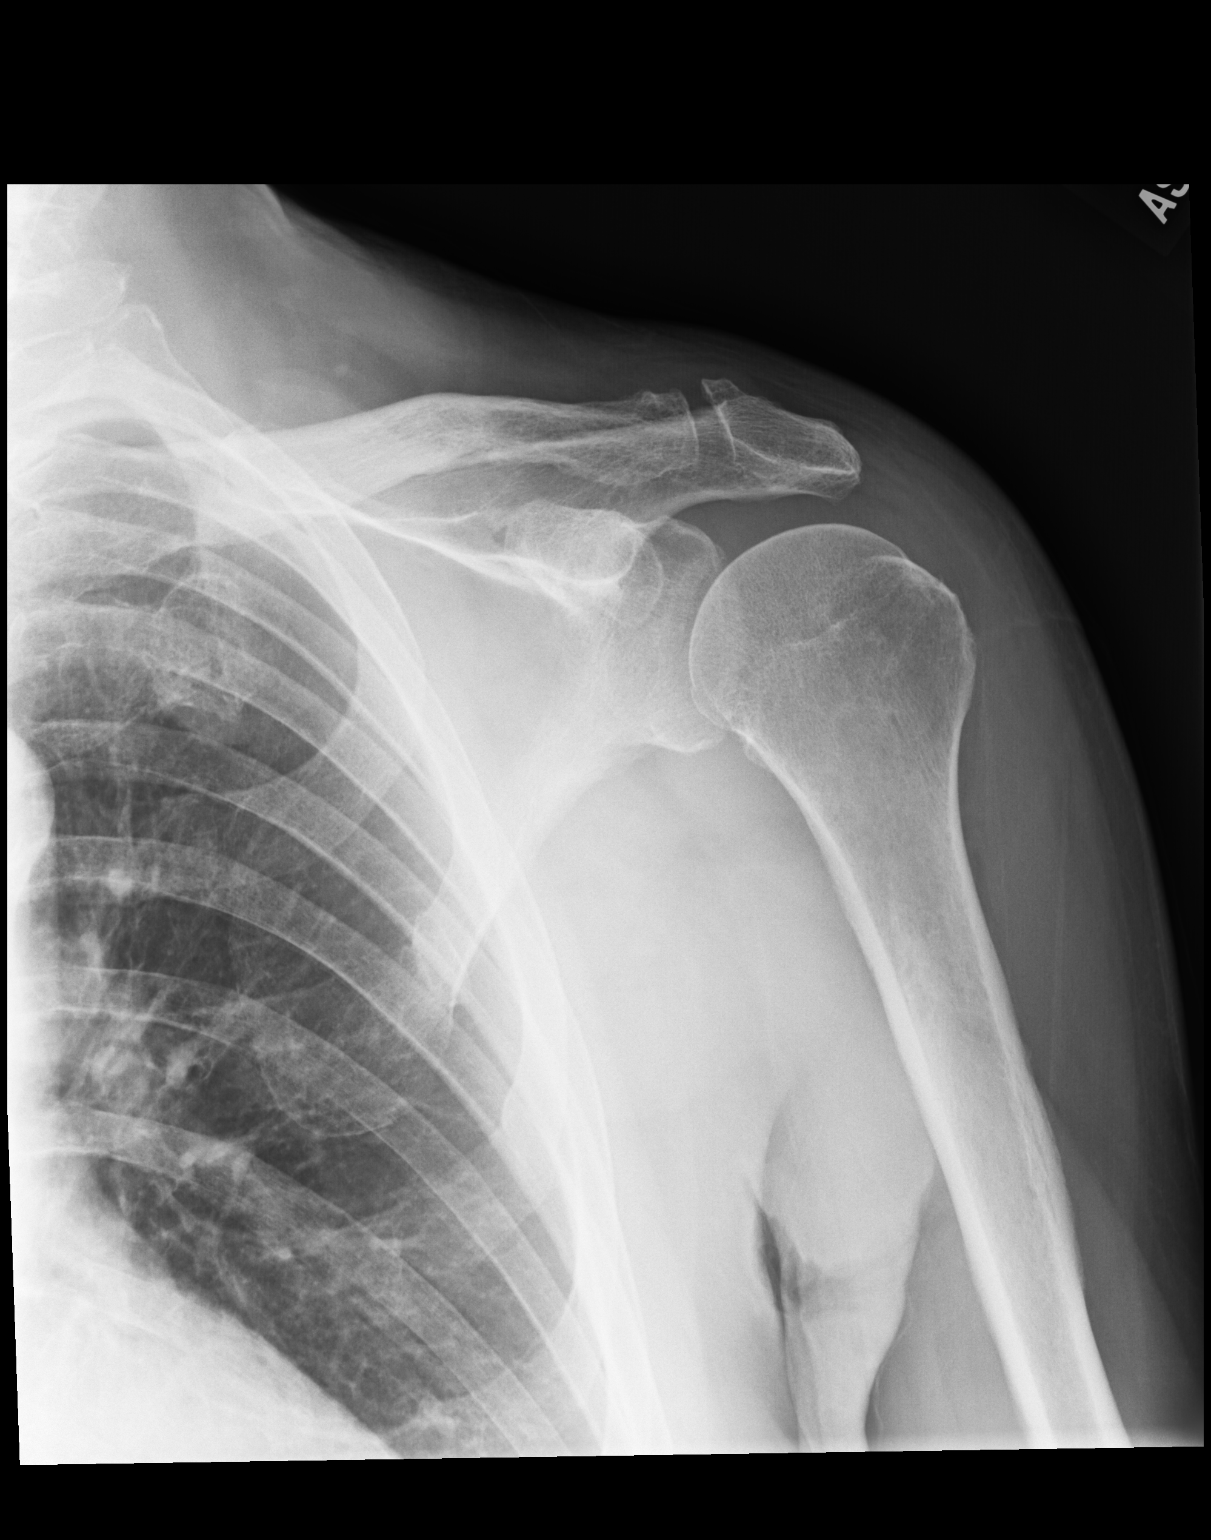

[dg shoulder left (2 of 2)]
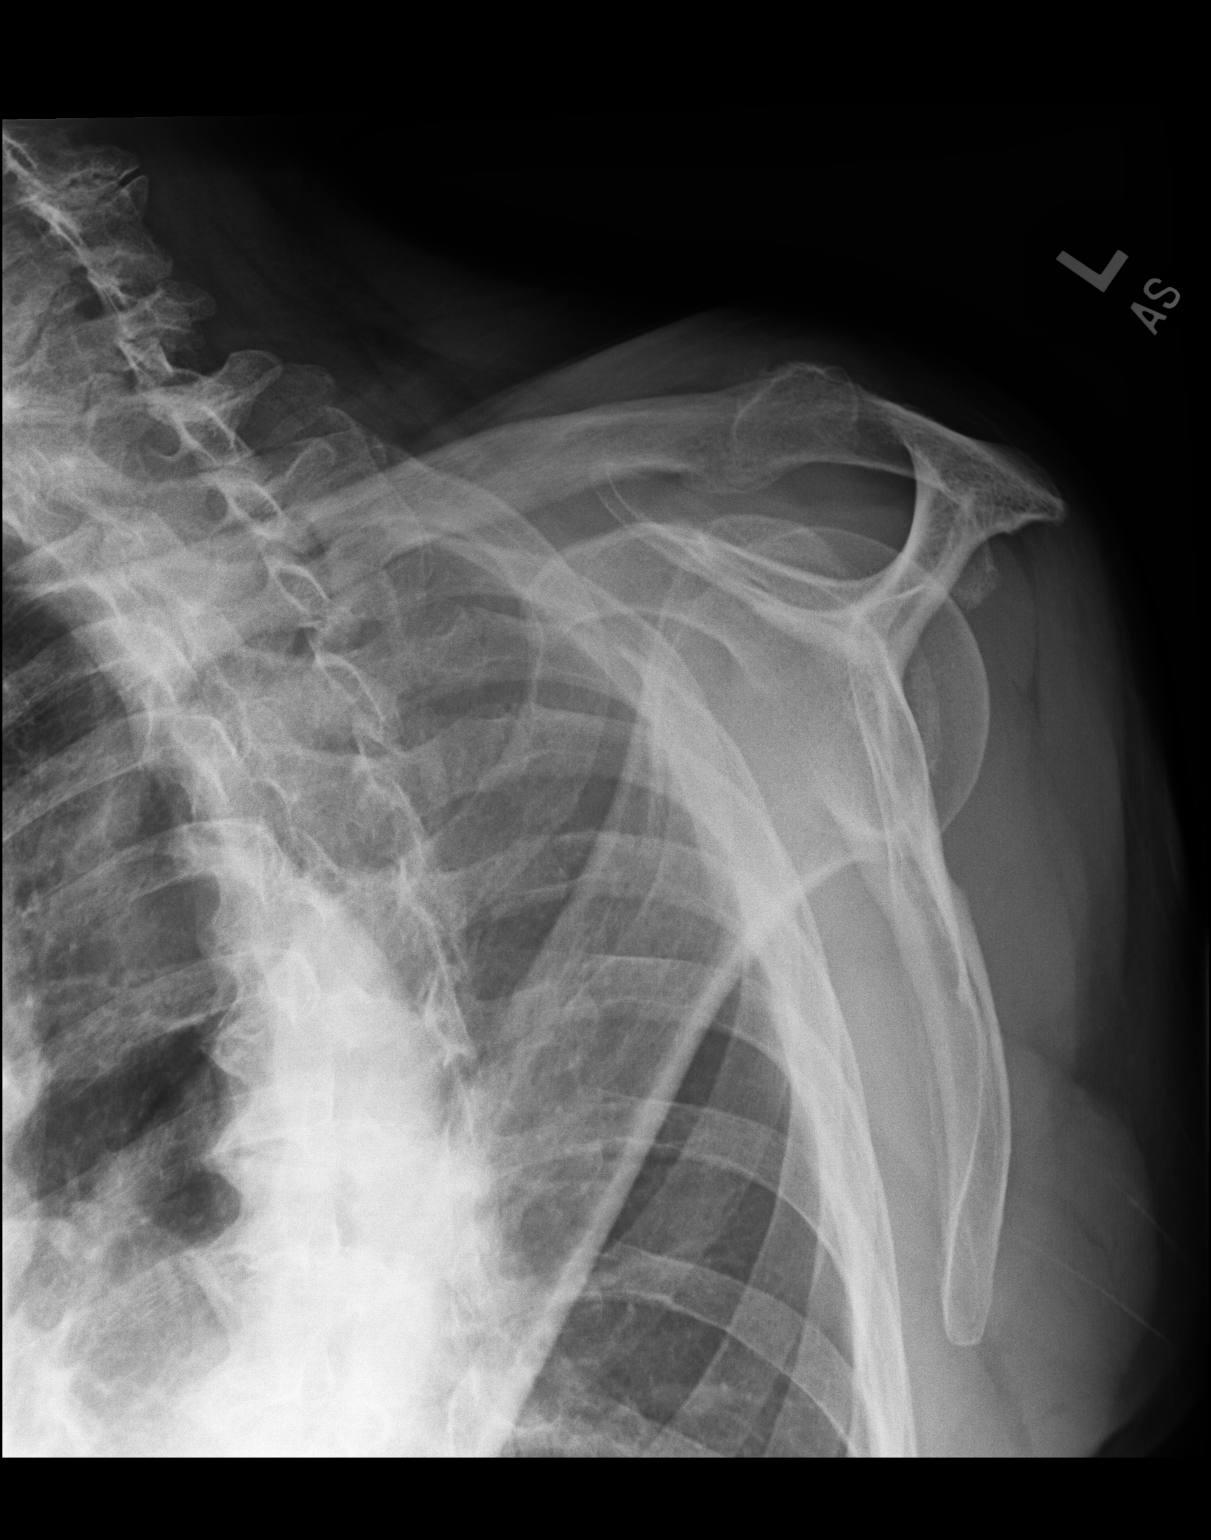

[2 of 2 positions shown; findings below may reference images not displayed]

FINDINGS: Mild AC joint degenerative change and inferior glenohumeral
degenerative change. No fracture or malalignment.
IMPRESSION: Mild degenerative changes.  No acute osseous abnormality

## 2019-10-14 NOTE — Progress Notes (Signed)
Primary Physician/Referring:  [name], MD  Patient ID: Kevin Levine, male    DOB: [birth year], 86 y.o.   MRN: [id number]  Chief Complaint  Patient presents with  . Atherosclerosis of native coronary artery of native heart wi  . Hypertension  . AAA  . Follow-up    c/o fatigue   HPI:    Kevin Levine  is a 86 y.o. Caucasian male with CAD status post balloon angioplasty of left circumflex and stenting to distal left main and the proximal LAD in September 2016, hypertension, hyperlipidemia, stable stage 3 chronic kidney disease, and gout. He has chronic bilateral leg edema due to varicose veins, has been wearing support stockings on the right leg only. Also found to have mild dilatation of the abdominal aorta by duplex.  He made an appointment to see me for daytime somnolence, fatigue and decreased exercise tolerance. Denies chest pain, palpitations, dizziness or syncope. 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  He has been extremely stressed in view of his wife's help, she has had multiple surgeries and has lost significant weight and he feels lost as he does not have any answers of why she is not getting better.  He is also not been sleeping well and also states that he has "hayfever protocols and it is worse and he feels very stuffy.  Past Medical History:  Diagnosis Date  . Arthritis   . Chicken pox   . Colon polyps   . Coronary artery disease   . ED (erectile dysfunction)   . Essential hypertension   . Gout   . Heart disease   . Hyperlipidemia   . Kidney disease   . Seasonal allergies    Past Surgical History:  Procedure Laterality Date  . APPENDECTOMY    . CATARACT EXTRACTION Bilateral   . CORONARY ANGIOPLASTY WITH STENT PLACEMENT  2016  . Status post ballon angioplasty of the left circumflex and drug eluting stent to the distal left main into the proximal LAD   03/14/2015   Social History   Tobacco Use  . Smoking status: Never Smoker   . Smokeless tobacco: Never Used  Substance Use Topics  . Alcohol use: Yes    Alcohol/week: 21.0 standard drinks    Types: 14 Glasses of wine, 7 Standard drinks or equivalent per week   Marital Status: Married  ROS  Review of Systems  Constitution: Positive for malaise/fatigue.  Cardiovascular: Negative for chest pain, dyspnea on exertion and leg swelling.  Gastrointestinal: Negative for melena.   Objective  Blood pressure (!) 114/57, pulse 74, temperature (!) 96.9 F (36.1 C), height 5' 11" (1.803 m), weight 215 lb (97.5 kg), SpO2 96 %.  Vitals with BMI 10/15/2019 03/10/2019 09/06/2017  Height 5' 11" 5' 11" 5' 11"  Weight 215 lbs 212 lbs 201 lbs  BMI 30 96.28 36.62  Systolic 947 654 650  Diastolic 57 71 76  Pulse 74 65 87     Physical Exam  Constitutional: He appears well-developed and well-nourished. No distress.  HENT:  Head: Atraumatic.  Neck: No JVD present.  Cardiovascular: Normal rate, regular rhythm, normal heart sounds and intact distal pulses. Exam reveals no gallop.  No murmur heard. Varicose veins mild noted bilateral. 1+ bilateral pitting edema, no JVD.  Pulmonary/Chest: Effort normal and breath sounds normal.  Abdominal: Soft. Bowel sounds are normal.  Musculoskeletal:        General: Normal range of motion.   Laboratory examination:   External  labs :   12/28/2017: Total cholesterol 113, triglycerides 77, HDL 47, LDL 51 mg. Serum glucose 112 mg, BUN 22, creatinine 1.64, eGFR 38/44 mL, potassium 4.4. CMP otherwise normal.  05/23/2017: Cholesterol 180, triglycerides 152, HDL 44, LDL 105. TSH 2.6. CBC normal. Creatinine 1.48, potassium 4.3, EGFR of 48, BMP otherwise normal. Hepatic function panel normal.  Medications and allergies  No Known Allergies   Current Outpatient Medications  Medication Instructions  . allopurinol (ZYLOPRIM) 100 mg, Oral, Daily  . amLODipine (NORVASC) 5 mg, Oral, Daily  . aspirin EC 81 mg, Oral, Daily  . atorvastatin (LIPITOR)  80 mg, Oral, Daily  . doxazosin (CARDURA) 2 mg, Oral, Daily  . ezetimibe (ZETIA) 10 mg, Oral, Daily after supper  . ferrous sulfate 325 mg, Oral, Every other day  . loratadine (CLARITIN) 10 mg, Oral, Daily PRN  . losartan (COZAAR) 100 mg, Oral, Daily  . metoprolol succinate (TOPROL-XL) 25 mg, Oral, Daily  . nitroGLYCERIN (NITROSTAT) 0.4 mg, Sublingual, Every 5 min PRN  . zolpidem (AMBIEN) 5 mg, Oral, At bedtime PRN    Radiology:  No results found.  Cardiac Studies:   Coronary Angiography 11/01/2014: S/P resolute DES 3.5 x 18 mm stent into the distal left main into the LAD and balloon angioplasty to circumflex arteon   Echocardiogram 10/16/2017: Left ventricle cavity is normal in size. Mild concentric hypertrophy of the left ventricle. Normal global wall motion. Normal diastolic filling pattern. Calculated EF 55%. Mild tricuspid regurgitation. No evidence of pulmonary hypertension.  Exercise myoview stress 10/07/2017: 1. The patient performed treadmill exercise using a Bruce protocol, completing 4:59 minutes. The patient completed an estimated workload of 7.03 METS, reaching 86% of the maximum predicted heart rate. No stress symptoms reported. Exercise capacity is fair. Normal hemodynamic response. Stress electrocardiogram showed no ischemic changes. 2. The overall quality of the study is excellent. There is no evidence of abnormal lung activity. Stress and rest SPECT images demonstrate homogeneous tracer distribution throughout the myocardium. Gated SPECT imaging reveals normal myocardial thickening and wall motion. The left ventricular ejection fraction was normal (53%).  3. Low risk study.  Abdominal aortic duplex 10/16/2017: Mild diffuse calcific plaque noted in abdominal aorta. An abdominal aortic aneurysm measuring 2.85 x 2.82 x 2.73 cm is seen in mid aorta. There is mild ectasia of the abdominal aorta. The bilateral iliac arteries are also dilated at 15 mm. Normal velocity. Recheck  in 2 years.  Pulse oximetry, overnight 03/12/2019: Study suggest very mild nocturnal hypoxemia.  EKG  10/15/2019: NSR, LAE, LAD, LAFB. IRBBB. Normal QT. No ischemia.  No significant change from EKG 03/10/2019.  Assessment     ICD-10-CM   1. Atherosclerosis of native coronary artery of native heart with angina pectoris ([place])  I25.119 EKG 12-Lead  2. Malaise and fatigue  R53.81    R53.83   3. Essential hypertension  I10   4. Abdominal aortic aneurysm (AAA) without rupture (H[facility])  I71.4      No orders of the defined types were placed in this encounter.   There are no discontinued medications.   Recommendations:   Murry Diaz  is a 86 y.o. Caucasian male with CAD status post balloon angioplasty of left circumflex and stenting to distal left main and the proximal LAD in September 2016, hypertension, hyperlipidemia, stable stage 3 chronic kidney disease, and gout.   He made an appointment to see me due to marked fatigue and decreased exercise tolerance.  Denies chest pain, dyspnea, PND or orthopnea.  His symptoms  are clearly related to lack of sleep, his wife is extremely ill and has had multiple physician office visits and hospitalization.  He is also not been sleeping well due to congestion in his nose from seasonal allergies.  I do not suspect heart failure or progression of my artery disease.  I simply reassured him.  I reviewed his previously performed labs, lipids are well controlled, blood pressure is also well controlled and he is on appropriate medical therapy.  No changes were done by me, I did offer him repeating nocturnal oximetry as it was mildly abnormal previously.  Patient will try decongestants first and try to get some sleep and he will let me know if he still continues to have persistent symptoms of fatigue.  Otherwise I will see him back in a year.  Adrian Prows, MD, [facility] 10/15/2019, 11:13 AM [facility]. [place] Office: [phone]

## 2019-10-15 ENCOUNTER — Encounter: Payer: Self-pay | Admitting: Cardiology

## 2019-10-15 ENCOUNTER — Ambulatory Visit: Payer: Medicare Other | Admitting: Cardiology

## 2019-10-15 ENCOUNTER — Other Ambulatory Visit: Payer: Self-pay

## 2019-10-15 VITALS — BP 114/57 | HR 74 | Temp 96.9°F | Ht 71.0 in | Wt 215.0 lb

## 2019-10-15 DIAGNOSIS — R5381 Other malaise: Secondary | ICD-10-CM

## 2019-10-15 DIAGNOSIS — I714 Abdominal aortic aneurysm, without rupture, unspecified: Secondary | ICD-10-CM

## 2019-10-15 DIAGNOSIS — I10 Essential (primary) hypertension: Secondary | ICD-10-CM

## 2019-10-15 DIAGNOSIS — I25119 Atherosclerotic heart disease of native coronary artery with unspecified angina pectoris: Secondary | ICD-10-CM

## 2019-10-15 DIAGNOSIS — R5383 Other fatigue: Secondary | ICD-10-CM | POA: Diagnosis not present

## 2019-10-19 DIAGNOSIS — M25512 Pain in left shoulder: Secondary | ICD-10-CM | POA: Diagnosis not present

## 2019-10-19 DIAGNOSIS — M25571 Pain in right ankle and joints of right foot: Secondary | ICD-10-CM | POA: Diagnosis not present

## 2019-10-19 DIAGNOSIS — M25612 Stiffness of left shoulder, not elsewhere classified: Secondary | ICD-10-CM | POA: Diagnosis not present

## 2019-10-23 DIAGNOSIS — M25512 Pain in left shoulder: Secondary | ICD-10-CM | POA: Diagnosis not present

## 2019-10-23 DIAGNOSIS — M25571 Pain in right ankle and joints of right foot: Secondary | ICD-10-CM | POA: Diagnosis not present

## 2019-10-23 DIAGNOSIS — M25612 Stiffness of left shoulder, not elsewhere classified: Secondary | ICD-10-CM | POA: Diagnosis not present

## 2019-11-02 DIAGNOSIS — M25512 Pain in left shoulder: Secondary | ICD-10-CM | POA: Diagnosis not present

## 2019-11-02 DIAGNOSIS — M25612 Stiffness of left shoulder, not elsewhere classified: Secondary | ICD-10-CM | POA: Diagnosis not present

## 2019-11-02 DIAGNOSIS — M25571 Pain in right ankle and joints of right foot: Secondary | ICD-10-CM | POA: Diagnosis not present

## 2019-11-04 DIAGNOSIS — M25571 Pain in right ankle and joints of right foot: Secondary | ICD-10-CM | POA: Diagnosis not present

## 2019-11-04 DIAGNOSIS — M25612 Stiffness of left shoulder, not elsewhere classified: Secondary | ICD-10-CM | POA: Diagnosis not present

## 2019-11-04 DIAGNOSIS — M25512 Pain in left shoulder: Secondary | ICD-10-CM | POA: Diagnosis not present

## 2019-11-10 ENCOUNTER — Other Ambulatory Visit: Payer: Self-pay | Admitting: *Deleted

## 2019-11-10 DIAGNOSIS — I739 Peripheral vascular disease, unspecified: Secondary | ICD-10-CM

## 2019-11-11 DIAGNOSIS — M25512 Pain in left shoulder: Secondary | ICD-10-CM | POA: Diagnosis not present

## 2019-11-11 DIAGNOSIS — M25612 Stiffness of left shoulder, not elsewhere classified: Secondary | ICD-10-CM | POA: Diagnosis not present

## 2019-11-11 DIAGNOSIS — M25571 Pain in right ankle and joints of right foot: Secondary | ICD-10-CM | POA: Diagnosis not present

## 2019-11-13 DIAGNOSIS — M25512 Pain in left shoulder: Secondary | ICD-10-CM | POA: Diagnosis not present

## 2019-11-13 DIAGNOSIS — M25571 Pain in right ankle and joints of right foot: Secondary | ICD-10-CM | POA: Diagnosis not present

## 2019-11-13 DIAGNOSIS — M25612 Stiffness of left shoulder, not elsewhere classified: Secondary | ICD-10-CM | POA: Diagnosis not present

## 2019-11-16 ENCOUNTER — Telehealth (HOSPITAL_COMMUNITY): Payer: Self-pay

## 2019-11-16 NOTE — Telephone Encounter (Signed)

## 2019-11-17 ENCOUNTER — Other Ambulatory Visit: Payer: Self-pay

## 2019-11-17 ENCOUNTER — Ambulatory Visit (HOSPITAL_COMMUNITY)
Admission: RE | Admit: 2019-11-17 | Discharge: 2019-11-17 | Disposition: A | Discharge status: Home / Self Care | Payer: Medicare Other | Source: Ambulatory Visit | Attending: Internal Medicine | Admitting: Internal Medicine

## 2019-11-17 ENCOUNTER — Ambulatory Visit (INDEPENDENT_AMBULATORY_CARE_PROVIDER_SITE_OTHER): Payer: Medicare Other | Admitting: Vascular Surgery

## 2019-11-17 ENCOUNTER — Inpatient Hospital Stay (HOSPITAL_COMMUNITY): Admission: RE | Admit: 2019-11-17 | Payer: Medicare Other | Source: Ambulatory Visit

## 2019-11-17 ENCOUNTER — Encounter: Payer: Self-pay | Admitting: Vascular Surgery

## 2019-11-17 DIAGNOSIS — I739 Peripheral vascular disease, unspecified: Secondary | ICD-10-CM

## 2019-11-17 DIAGNOSIS — M79606 Pain in leg, unspecified: Secondary | ICD-10-CM | POA: Insufficient documentation

## 2019-11-17 DIAGNOSIS — M79604 Pain in right leg: Secondary | ICD-10-CM

## 2019-11-17 NOTE — Progress Notes (Signed)
Patient name: Kevin Levine MRN: [id number] DOB: 1932/06/18 Sex: male  REASON FOR CONSULT: Evaluate right lower extremity pain, hx right leg nerve injury  HPI: Kevin Levine is a 86 y.o. male, with history of hypertension, hyperlipidemia, coronary artery disease, chronic kidney disease that presents for evaluation of right leg pain.  Patient states he had an injury to his right leg about 10 years ago when he was moving a piece of art.  Ultimately after this he developed a foot drop on the right and was told he had peroneal nerve damage.  He had a nerve release [place] with no significant relief.  States he ultimately underwent shock therapy in [place] with improvement.  He has noticed over the last little while increasing pain in his right leg mostly in the anterior lateral compartment of his calf when walking about three quarters of a mile.  He wanted to be evaluated here to ensure there was no other underlying etiology or anything that we could offer him.  He denies any previous lower extremity revascularizations or other vascular interventions.  Has had a previous stent placed in his LAD.  Past Medical History:  Diagnosis Date  . Arthritis   . Chicken pox   . Colon polyps   . Coronary artery disease   . ED (erectile dysfunction)   . Essential hypertension   . Gout   . Heart disease   . Hyperlipidemia   . Kidney disease   . Seasonal allergies     Past Surgical History:  Procedure Laterality Date  . APPENDECTOMY    . CATARACT EXTRACTION Bilateral   . CORONARY ANGIOPLASTY WITH STENT PLACEMENT  2016  . Status post ballon angioplasty of the left circumflex and drug eluting stent to the distal left main into the proximal LAD   03/14/2015    Family History  Problem Relation Age of Onset  . Lung cancer Mother   . Aneurysm Brother   . Cancer Paternal Grandfather   . Heart attack Maternal Grandfather     SOCIAL HISTORY: Social History   Socioeconomic History  .  Marital status: Married    Spouse name: Not on file  . Number of children: 3  . Years of education: Not on file  . Highest education level: Not on file  Occupational History  . Not on file  Tobacco Use  . Smoking status: Never Smoker  . Smokeless tobacco: Never Used  Substance and Sexual Activity  . Alcohol use: Yes    Alcohol/week: 21.0 standard drinks    Types: 14 Glasses of wine, 7 Standard drinks or equivalent per week  . Drug use: No  . Sexual activity: Not on file  Other Topics Concern  . Not on file  Social History Narrative   Retired - works as an Herbalist.   Social Determinants of Health   Financial Resource Strain:   . Difficulty of Paying Living Expenses:   Food Insecurity:   . Worried About Charity fundraiser in the Last Year:   . Arboriculturist in the Last Year:   Transportation Needs:   . Film/video editor (Medical):   Marland Kitchen Lack of Transportation (Non-Medical):   Physical Activity:   . Days of Exercise per Week:   . Minutes of Exercise per Session:   Stress:   . Feeling of Stress :   Social Connections:   . Frequency of Communication with Friends and Family:   .  Frequency of Social Gatherings with Friends and Family:   . Attends Religious Services:   . Active Member of Clubs or Organizations:   . Attends Archivist Meetings:   Marland Kitchen Marital Status:   Intimate Partner Violence:   . Fear of Current or Ex-Partner:   . Emotionally Abused:   Marland Kitchen Physically Abused:   . Sexually Abused:     No Known Allergies  Current Outpatient Medications  Medication Sig Dispense Refill  . allopurinol (ZYLOPRIM) 100 MG tablet Take 1 tablet (100 mg total) by mouth daily. 90 tablet 3  . amLODipine (NORVASC) 5 MG tablet Take 1 tablet (5 mg total) by mouth daily. 90 tablet 3  . aspirin EC 81 MG tablet Take 81 mg by mouth daily.    Marland Kitchen atorvastatin (LIPITOR) 80 MG tablet Take 1 tablet (80 mg total) by mouth daily. 90 tablet 3  . doxazosin (CARDURA) 2  MG tablet Take 1 tablet (2 mg total) by mouth daily. 90 tablet 3  . ezetimibe (ZETIA) 10 MG tablet Take 10 mg by mouth daily after supper.   1  . ferrous sulfate 325 (65 FE) MG tablet Take 325 mg by mouth every other day.    . loratadine (CLARITIN) 10 MG tablet Take 10 mg by mouth daily as needed for allergies.    Marland Kitchen losartan (COZAAR) 100 MG tablet Take 1 tablet (100 mg total) by mouth daily. 90 tablet 1  . metoprolol succinate (TOPROL-XL) 25 MG 24 hr tablet Take 25 mg by mouth daily.   1  . nitroGLYCERIN (NITROSTAT) 0.4 MG SL tablet Place 0.4 mg under the tongue every 5 (five) minutes as needed.   4  . zolpidem (AMBIEN) 5 MG tablet Take 5 mg by mouth at bedtime as needed for sleep.     No current facility-administered medications for this visit.    REVIEW OF SYSTEMS:  [X]  denotes positive finding, [ ]  denotes negative finding Cardiac  Comments:  Chest pain or chest pressure:    Shortness of breath upon exertion:    Short of breath when lying flat:    Irregular heart rhythm:        Vascular    Pain in calf, thigh, or hip brought on by ambulation: x Right calf  Pain in feet at night that wakes you up from your sleep:     Blood clot in your veins:    Leg swelling:         Pulmonary    Oxygen at home:    Productive cough:     Wheezing:         Neurologic    Sudden weakness in arms or legs:     Sudden numbness in arms or legs:     Sudden onset of difficulty speaking or slurred speech:    Temporary loss of vision in one eye:     Problems with dizziness:         Gastrointestinal    Blood in stool:     Vomited blood:         Genitourinary    Burning when urinating:     Blood in urine:        Psychiatric    Major depression:         Hematologic    Bleeding problems:    Problems with blood clotting too easily:        Skin    Rashes or ulcers:        Constitutional  Fever or chills:      PHYSICAL EXAM: Vitals:   11/17/19 1032  BP: 136/65  Pulse: 63  Resp: 18    Temp: 97.9 F (36.6 C)  TempSrc: Temporal  SpO2: 98%  Weight: 218 lb (98.9 kg)  Height: 5\' 11"  (1.803 m)    GENERAL: The patient is a well-nourished male, in no acute distress. The vital signs are documented above. CARDIAC: There is a regular rate and rhythm.  VASCULAR:  Palpable femoral pulses bilaterally Palpable popliteal pulses bilaterally Palpable dorsalis pedis and posterior tibial pulses bilaterally Scar on right lateral calf proximally from nerve relief Calf compartments soft  PULMONARY: There is good air exchange bilaterally without wheezing or rales. ABDOMEN: Soft and non-tender with normal pitched bowel sounds.  MUSCULOSKELETAL: There are no major deformities or cyanosis. NEUROLOGIC: No focal weakness or paresthesias are detected. SKIN: There are no ulcers or rashes noted. PSYCHIATRIC: The patient has a normal affect.  DATA:   I independently reviewed his ABIs which are 1.27 on the right triphasic and 1.3 on the left triphasic  Assessment/Plan:  86 year old male that presents with right lower extremity pain after walking about three quarters of a mile.  I discussed with him in detail that I do not think he has any significant underlying vascular disease that would explain his pain.  He has easily palpable pedal pulses on exam with normal ABIs above 1 and triphasic waveforms at the ankle.  I think his pain is more in the anterior lateral compartment and likely neuropathic given his previous nerve injury about 10 years ago.  I do not think he needs any vascular intervention.  I offered to refer him to neurology to see if they have any other therapies that they could offer at this time and sounds like he has tried gabapentin in the past.  Ultimately he would prefer that I send a note to his primary care doctor Dr. Lysle Rubens and he will talk with Dr. Lysle Rubens about a possible referral to neurology.  I told him I'd be happy to see him as needed for follow-up.   Marty Heck, MD [facility] Office: [phone]

## 2019-11-25 DIAGNOSIS — M25612 Stiffness of left shoulder, not elsewhere classified: Secondary | ICD-10-CM | POA: Diagnosis not present

## 2019-11-25 DIAGNOSIS — M25512 Pain in left shoulder: Secondary | ICD-10-CM | POA: Diagnosis not present

## 2019-11-25 DIAGNOSIS — M25571 Pain in right ankle and joints of right foot: Secondary | ICD-10-CM | POA: Diagnosis not present

## 2019-12-08 ENCOUNTER — Telehealth: Payer: Self-pay | Admitting: Orthopedic Surgery

## 2019-12-08 NOTE — Telephone Encounter (Signed)
Ok to call him back to schedule for cortisone injection.  Thank you.

## 2019-12-08 NOTE — Telephone Encounter (Signed)
Patient called.   He wants to get set up for another injection but neither of [place] were able to tell if he gets gel or cortisone. If it is cortisone just let me know and i'll get him scheduled.   Call back: [phone]

## 2019-12-11 ENCOUNTER — Ambulatory Visit (INDEPENDENT_AMBULATORY_CARE_PROVIDER_SITE_OTHER): Payer: Medicare Other | Admitting: Surgical

## 2019-12-11 ENCOUNTER — Other Ambulatory Visit: Payer: Self-pay

## 2019-12-11 ENCOUNTER — Telehealth: Payer: Self-pay

## 2019-12-11 DIAGNOSIS — M1711 Unilateral primary osteoarthritis, right knee: Secondary | ICD-10-CM | POA: Diagnosis not present

## 2019-12-11 NOTE — Telephone Encounter (Signed)
Can we get patient approved for gel injection in his right knee? Thanks.

## 2019-12-11 NOTE — Telephone Encounter (Signed)
Noted  

## 2019-12-14 ENCOUNTER — Telehealth: Payer: Self-pay

## 2019-12-14 NOTE — Telephone Encounter (Signed)
Submitted VOB for Monovisc, right knee. 

## 2019-12-24 DIAGNOSIS — M179 Osteoarthritis of knee, unspecified: Secondary | ICD-10-CM | POA: Diagnosis not present

## 2019-12-24 DIAGNOSIS — R0609 Other forms of dyspnea: Secondary | ICD-10-CM | POA: Diagnosis not present

## 2019-12-24 DIAGNOSIS — N183 Chronic kidney disease, stage 3 unspecified: Secondary | ICD-10-CM | POA: Diagnosis not present

## 2019-12-24 DIAGNOSIS — R5383 Other fatigue: Secondary | ICD-10-CM | POA: Diagnosis not present

## 2019-12-24 DIAGNOSIS — I2510 Atherosclerotic heart disease of native coronary artery without angina pectoris: Secondary | ICD-10-CM | POA: Diagnosis not present

## 2019-12-24 DIAGNOSIS — R413 Other amnesia: Secondary | ICD-10-CM | POA: Diagnosis not present

## 2019-12-24 DIAGNOSIS — N529 Male erectile dysfunction, unspecified: Secondary | ICD-10-CM | POA: Diagnosis not present

## 2019-12-29 ENCOUNTER — Encounter: Payer: Self-pay | Admitting: Surgical

## 2019-12-29 ENCOUNTER — Telehealth: Payer: Self-pay

## 2019-12-29 DIAGNOSIS — M1711 Unilateral primary osteoarthritis, right knee: Secondary | ICD-10-CM | POA: Diagnosis not present

## 2019-12-29 NOTE — Progress Notes (Signed)
Office Visit Note   Patient: Kevin Levine           Date of Birth: [birth year]           MRN: [id number] Visit Date: 12/11/2019 Requested by: [name], MD [facility] [employer] [address],  [address] [address] PCP: [name], MD  Subjective: Chief Complaint  Patient presents with  . Right Knee - Pain    HPI: Kevin Levine is a 86 y.o. male who presents to the office complaining of right knee pain.  Patient has a history of right knee osteoarthritis.  Patient localizes pain diffusely throughout the right knee.  He denies any groin pain or radicular low back pain.  Denies any mechanical or instability symptoms.  Pain does not wake him up at night.  He notes a 1 mile walking endurance.  He states that he wants a right knee injection as he is traveling to a festival in [place] and will be on his feet for much of the weekend..                ROS:  All systems reviewed are negative as they relate to the chief complaint within the history of present illness.  Patient denies fevers or chills.  Assessment & Plan: Visit Diagnoses: No diagnosis found.  Plan: Patient is an 86 year old male who presents complaining of right knee pain.  He is currently experiencing diffuse right knee pain without instability or mechanical symptoms.  Has a history of osteoarthritis of the right knee.  Previous radiographs were reviewed.  He request cortisone injection.  Cortisone injection administered and patient tolerated the procedure well.  Plan for patient to follow-up with the office as needed.  Follow-Up Instructions: No follow-ups on file.   Orders:  No orders of the defined types were placed in this encounter.  No orders of the defined types were placed in this encounter.     Procedures: Large Joint Inj: R knee on 12/29/2019 5:26 PM Indications: diagnostic evaluation, joint swelling and pain Details: 18 G 1.5 in needle, superolateral approach  Arthrogram: No  Medications: 5 mL  lidocaine 1 %; 40 mg methylPREDNISolone acetate 40 MG/ML; 4 mL bupivacaine 0.25 % Outcome: tolerated well, no immediate complications Procedure, treatment alternatives, risks and benefits explained, specific risks discussed. Consent was given by the patient. Immediately prior to procedure a time out was called to verify the correct patient, procedure, equipment, support staff and site/side marked as required. Patient was prepped and draped in the usual sterile fashion.       Clinical Data: No additional findings.  Objective: Vital Signs: There were no vitals taken for this visit.  Physical Exam:  Constitutional: Patient appears well-developed HEENT:  Head: Normocephalic Eyes:EOM are normal Neck: Normal range of motion Cardiovascular: Normal rate Pulmonary/chest: Effort normal Neurologic: Patient is alert Skin: Skin is warm Psychiatric: Patient has normal mood and affect  Ortho Exam:  Right knee Exam No effusion 5 degree flexion contracture Mild tenderness to palpation over the medial lateral joint lines Extensor mechanism intact No TTP over the quad tendon, patellar tendon, pes anserinus, patella, tibial tubercle, LCL/MCL insertions Stable to varus/valgus stresses.  Stable to anterior/posterior drawer Flexion > 90 degrees  Specialty Comments:  No specialty comments available.  Imaging: No results found.   PMFS History: Patient Active Problem List   Diagnosis Date Noted  . Leg pain 11/17/2019  . Claudication of lower extremity ([place]) 11/01/2017  . Aortic aneurysm, abdominal ([place]) 11/01/2017  .  Hypercholesteremia 11/01/2017  . BPH associated with nocturia 11/01/2017  . Coronary atherosclerosis of native coronary artery 11/01/2017   Past Medical History:  Diagnosis Date  . Arthritis   . Chicken pox   . Colon polyps   . Coronary artery disease   . ED (erectile dysfunction)   . Essential hypertension   . Gout   . Heart disease   . Hyperlipidemia   . Kidney  disease   . Seasonal allergies     Family History  Problem Relation Age of Onset  . Lung cancer Mother   . Aneurysm Brother   . Cancer Paternal Grandfather   . Heart attack Maternal Grandfather     Past Surgical History:  Procedure Laterality Date  . APPENDECTOMY    . CATARACT EXTRACTION Bilateral   . CORONARY ANGIOPLASTY WITH STENT PLACEMENT  2016  . Status post ballon angioplasty of the left circumflex and drug eluting stent to the distal left main into the proximal LAD   03/14/2015   Social History   Occupational History  . Not on file  Tobacco Use  . Smoking status: Never Smoker  . Smokeless tobacco: Never Used  Vaping Use  . Vaping Use: Never used  Substance and Sexual Activity  . Alcohol use: Yes    Alcohol/week: 21.0 standard drinks    Types: 14 Glasses of wine, 7 Standard drinks or equivalent per week  . Drug use: No  . Sexual activity: Not on file

## 2019-12-29 NOTE — Telephone Encounter (Signed)
Talked with patient and advised him that he is approved for gel injection.  Patient stated that he will hold off for the time being and will give [place] a call back when he is ready to proceed.  Approved for Monovisc, right knee. [place] [employer] insurance will pick up remaining eligible expenses at 100%. No Co-pay No PA required

## 2020-01-13 ENCOUNTER — Ambulatory Visit: Payer: TRICARE For Life (TFL) | Admitting: Cardiology

## 2020-01-14 ENCOUNTER — Ambulatory Visit: Payer: Medicare Other | Admitting: Cardiology

## 2020-01-14 ENCOUNTER — Other Ambulatory Visit: Payer: Self-pay

## 2020-01-14 ENCOUNTER — Encounter: Payer: Self-pay | Admitting: Cardiology

## 2020-01-14 VITALS — BP 143/71 | HR 63 | Resp 17 | Ht 71.0 in | Wt 218.0 lb

## 2020-01-14 DIAGNOSIS — R400 Somnolence: Secondary | ICD-10-CM

## 2020-01-14 DIAGNOSIS — R0609 Other forms of dyspnea: Secondary | ICD-10-CM | POA: Diagnosis not present

## 2020-01-14 DIAGNOSIS — R5383 Other fatigue: Secondary | ICD-10-CM

## 2020-01-14 DIAGNOSIS — I878 Other specified disorders of veins: Secondary | ICD-10-CM

## 2020-01-14 DIAGNOSIS — R600 Localized edema: Secondary | ICD-10-CM | POA: Diagnosis not present

## 2020-01-14 DIAGNOSIS — R5381 Other malaise: Secondary | ICD-10-CM | POA: Diagnosis not present

## 2020-01-14 DIAGNOSIS — I25119 Atherosclerotic heart disease of native coronary artery with unspecified angina pectoris: Secondary | ICD-10-CM

## 2020-01-14 DIAGNOSIS — R0600 Dyspnea, unspecified: Secondary | ICD-10-CM | POA: Diagnosis not present

## 2020-01-14 MED ORDER — FUROSEMIDE 20 MG PO TABS: 20.0000 mg | ORAL_TABLET | Freq: Every day | ORAL | 1 refills | Status: DC | PRN

## 2020-01-14 NOTE — Progress Notes (Signed)
Primary Physician/Referring:  [name], MD  Patient ID: Kevin Levine, male    DOB: 28-Jul-1931, 86 y.o.   MRN: [id number]  Chief Complaint  Patient presents with  . DOE  . Leg Swelling  . Coronary Artery Disease   HPI:    Kevin Levine  is a 86 y.o. Caucasian male with CAD status post balloon angioplasty of left circumflex and stenting to distal left main and the proximal LAD in September 2016, hypertension, hyperlipidemia, stable stage 3 chronic kidney disease, and gout. He has chronic bilateral leg edema due to varicose veins, has been wearing support stockings, has started noticing fatigue and also leg cramps recently over the last few months.  He has mild abdominal aortic ectasia by duplex.  He made an appointment to see me for daytime somnolence, fatigue and decreased exercise tolerance. Denies chest pain, palpitations, dizziness or syncope.  He had complained about the similar symptoms and is concerned that he could have progression of coronary artery disease.  He states that last time when he was here when he complained of similar symptoms, his wife had been sick and he was busy taking care of her and he was not sleeping well but this is since resolved but still continues to have similar symptoms.  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    Past Medical History:  Diagnosis Date  . Arthritis   . Chicken pox   . Colon polyps   . Coronary artery disease   . ED (erectile dysfunction)   . Essential hypertension   . Gout   . Heart disease   . Hyperlipidemia   . Kidney disease   . Seasonal allergies    Past Surgical History:  Procedure Laterality Date  . APPENDECTOMY    . CATARACT EXTRACTION Bilateral   . CORONARY ANGIOPLASTY WITH STENT PLACEMENT  2016  . Status post ballon angioplasty of the left circumflex and drug eluting stent to the distal left main into the proximal LAD    03/14/2015   Social History   Tobacco Use  . Smoking status: Never Smoker  . Smokeless tobacco: Never Used  Substance Use Topics  . Alcohol use: Yes    Alcohol/week: 21.0 standard drinks    Types: 14 Glasses of wine, 7 Standard drinks or equivalent per week   Marital Status: Married  ROS  Review of Systems  Constitutional: Positive for malaise/fatigue.  Cardiovascular: Positive for dyspnea on exertion. Negative for chest pain and leg swelling.  Gastrointestinal: Negative for melena.   Objective  Blood pressure (!) 143/71, pulse 63, resp. rate 17, height '5\' 11"'$  (1.803 m), weight (!) 218 lb (98.9 kg), SpO2 99 %.  Vitals with BMI 01/14/2020 11/17/2019 10/15/2019  Height '5\' 11"'$  '5\' 11"'$  '5\' 11"'$   Weight 218 lbs 218 lbs 215 lbs  BMI 40.98 11.91 30  Systolic 478 295 621  Diastolic 71 65 57  Pulse 63 63 74     Physical Exam Constitutional:      General: He is not in acute distress.    Appearance: He is well-developed.  Neck:     Vascular: No JVD.  Cardiovascular:     Rate and Rhythm: Normal rate and regular rhythm.     Pulses: Intact distal pulses.     Heart sounds: Normal heart sounds. No murmur heard.  No gallop.      Comments: Varicose veins mild noted  bilateral. 2+ bilateral pitting edema, no JVD. Pulmonary:     Effort: Pulmonary effort is normal.     Breath sounds: Normal breath sounds.  Abdominal:     General: Bowel sounds are normal.     Palpations: Abdomen is soft.    Laboratory examination:   BNP    Component Value Date/Time   BNP 51.7 01/14/2020 1541   External labs :   Cholesterol, total 113.000 07/24/2019 HDL 49.000 07/24/2019 LDL-C 31.000 07/24/2019 Triglycerides 167.000 07/24/2019   A1C 6.200 07/24/2019 TSH 2.750 12/24/2019   Hemoglobin 12.400 12/24/2019  Creatinine, Serum 1.440 12/24/2019 Potassium 4.300 12/24/2019 Magnesium N/D ALT (SGPT) 18.000 03/30/2019   12/28/2017: Total cholesterol 113, triglycerides 77, HDL 47, LDL 51 mg. Serum glucose 112 mg,  BUN 22, creatinine 1.64, eGFR 38/44 mL, potassium 4.4. CMP otherwise normal.  05/23/2017: Cholesterol 180, triglycerides 152, HDL 44, LDL 105. TSH 2.6. CBC normal. Creatinine 1.48, potassium 4.3, EGFR of 48, BMP otherwise normal. Hepatic function panel normal.  Medications and allergies  No Known Allergies   Current Outpatient Medications  Medication Instructions  . allopurinol (ZYLOPRIM) 100 mg, Oral, Daily  . amLODipine (NORVASC) 5 mg, Oral, Daily  . aspirin EC 81 mg, Oral, Daily  . atorvastatin (LIPITOR) 80 mg, Oral, Daily  . doxazosin (CARDURA) 2 mg, Oral, Daily  . ezetimibe (ZETIA) 10 mg, Oral, Daily after supper  . ferrous sulfate 325 mg, Oral, Every other day  . furosemide (LASIX) 20 mg, Oral, Daily PRN  . loratadine (CLARITIN) 10 mg, Daily PRN  . losartan (COZAAR) 100 mg, Oral, Daily  . metoprolol succinate (TOPROL-XL) 25 mg, Oral, Daily  . nitroGLYCERIN (NITROSTAT) 0.4 mg, Every 5 min PRN  . zolpidem (AMBIEN) 5 mg, Oral, At bedtime PRN    Radiology:  No results found.  Cardiac Studies:   Coronary Angiography 11/01/2014: S/P resolute DES 3.5 x 18 mm stent into the distal left main into the LAD and balloon angioplasty to circumflex arteon   Echocardiogram 10/16/2017: Left ventricle cavity is normal in size. Mild concentric hypertrophy of the left ventricle. Normal global wall motion. Normal diastolic filling pattern. Calculated EF 55%. Mild tricuspid regurgitation. No evidence of pulmonary hypertension.  Exercise myoview stress 10/07/2017: 1. The patient performed treadmill exercise using a Bruce protocol, completing 4:59 minutes. The patient completed an estimated workload of 7.03 METS, reaching 86% of the maximum predicted heart rate. No stress symptoms reported. Exercise capacity is fair. Normal hemodynamic response. Stress electrocardiogram showed no ischemic changes. 2. The overall quality of the study is excellent. There is no evidence of abnormal lung activity.  Stress and rest SPECT images demonstrate homogeneous tracer distribution throughout the myocardium. Gated SPECT imaging reveals normal myocardial thickening and wall motion. The left ventricular ejection fraction was normal (53%).  3. Low risk study.  Abdominal aortic duplex 10/16/2017: Mild diffuse calcific plaque noted in abdominal aorta. An abdominal aortic aneurysm measuring 2.85 x 2.82 x 2.73 cm is seen in mid aorta. There is mild ectasia of the abdominal aorta. The bilateral iliac arteries are also dilated at 15 mm. Normal velocity. Recheck in 2 years.  Pulse oximetry, overnight 03/12/2019: Study suggest very mild nocturnal hypoxemia.  ABI 11/17/2019: Right: Resting right ankle-brachial index is within normal range. No  evidence of significant right lower extremity arterial disease. The right  toe-brachial index is normal. RT great toe pressure = 151 mmHg.  Left: Resting left ankle-brachial index indicates noncompressible left  lower extremity arteries. The left toe-brachial index is normal. LT  Great  toe pressure = 179 mmHg.   EKG  10/15/2019: NSR, LAE, LAD, LAFB. IRBBB. Normal QT. No ischemia.  No significant change from EKG 03/10/2019.  Assessment     ICD-10-CM   1. Malaise and fatigue  R53.81    R53.83   2. Daytime somnolence  R40.0   3. Dyspnea on exertion  R06.00 PCV ECHOCARDIOGRAM COMPLETE    Brain natriuretic peptide  4. Atherosclerosis of native coronary artery of native heart with angina pectoris ([place])  I25.119 PCV ECHOCARDIOGRAM COMPLETE    PCV MYOCARDIAL PERFUSION WO LEXISCAN  5. Bilateral leg edema  R60.0 furosemide (LASIX) 20 MG tablet    DISCONTINUED: furosemide (LASIX) 20 MG tablet  6. Venous intermittent claudication  I87.8 Ambulatory referral to Vascular Surgery      Meds ordered this encounter  Medications  . DISCONTD: furosemide (LASIX) 20 MG tablet    Sig: Take 1 tablet (20 mg total) by mouth daily as needed for edema (Leg swelling).    Dispense:  30  tablet    Refill:  1  . furosemide (LASIX) 20 MG tablet    Sig: Take 1 tablet (20 mg total) by mouth daily as needed for edema (Leg swelling).    Dispense:  30 tablet    Refill:  1    Medications Discontinued During This Encounter  Medication Reason  . furosemide (LASIX) 20 MG tablet      Recommendations:   Kevin Levine  is a 86 y.o. Caucasian male with CAD status post balloon angioplasty of left circumflex and stenting to distal left main and the proximal LAD in September 2016, hypertension, hyperlipidemia, stable stage 3 chronic kidney disease, and gout.   He made an appointment to see me due to marked fatigue and decreased exercise tolerance and dyspnea on exertion.  I will obtain a BNP today and will obtain echocardiogram and exercise nuclear stress test to evaluate for progression of CAD in view of continued symptoms. (Normal BNP)  I reviewed his previously performed labs, lipids are well controlled, blood pressure is also well controlled and he is on appropriate medical therapy.  No changes were done by me.  With regard to daytime somnolence and fatigue, he may also have a component of sleep apnea.  We discussed regarding sleep study, he prefers to wait on this for now and wants to rule out cardiac issues.  With regard to leg edema, he is now having symptoms of claudication, suspect venous claudication as his vascular exam is normal and prescribed him furosemide 20 mg daily as needed and will refer him for venous insufficiency evaluation.  I will see him back after the tests and make further recommendations.   Adrian Prows, MD, [facility] 01/14/2020, 3:08 PM Office: [phone]

## 2020-01-15 LAB — BRAIN NATRIURETIC PEPTIDE: BNP: 51.7 pg/mL (ref 0.0–100.0)

## 2020-01-20 ENCOUNTER — Other Ambulatory Visit: Payer: TRICARE For Life (TFL)

## 2020-01-22 ENCOUNTER — Ambulatory Visit: Payer: Medicare Other

## 2020-01-22 ENCOUNTER — Other Ambulatory Visit: Payer: Self-pay

## 2020-01-22 DIAGNOSIS — I25119 Atherosclerotic heart disease of native coronary artery with unspecified angina pectoris: Secondary | ICD-10-CM

## 2020-01-22 DIAGNOSIS — R0609 Other forms of dyspnea: Secondary | ICD-10-CM

## 2020-01-29 ENCOUNTER — Ambulatory Visit (INDEPENDENT_AMBULATORY_CARE_PROVIDER_SITE_OTHER): Payer: Medicare Other | Admitting: Neurology

## 2020-01-29 ENCOUNTER — Encounter: Payer: Self-pay | Admitting: Neurology

## 2020-01-29 ENCOUNTER — Ambulatory Visit: Payer: Medicare Other | Admitting: Neurology

## 2020-01-29 ENCOUNTER — Other Ambulatory Visit: Payer: Self-pay

## 2020-01-29 VITALS — BP 131/65 | HR 67 | Ht 71.0 in | Wt 216.0 lb

## 2020-01-29 DIAGNOSIS — I25119 Atherosclerotic heart disease of native coronary artery with unspecified angina pectoris: Secondary | ICD-10-CM

## 2020-01-29 DIAGNOSIS — M48062 Spinal stenosis, lumbar region with neurogenic claudication: Secondary | ICD-10-CM | POA: Diagnosis not present

## 2020-01-29 NOTE — Progress Notes (Signed)
Reason for visit: Leg pain  Referring physician: Dr. Trina Ao Kevin Levine is a 86 y.o. male  History of present illness:  [name] is an 86 year old right-handed white male with a history of leg discomfort associated with walking that has been present for about 3 to 4 years.  Initially, he has noted discomfort in both legs if he walks longer distances, and the leg pain gets better with rest.  He typically walks about 3/4 to 1 mile daily, and with this walk he gets discomfort in the legs.  He has noted some swelling in the left leg in particular, he recently was placed on a diuretic which she has been on for about 2 weeks and he has noted that the left leg discomfort has improved.  When the patient does walk, he may have some shortness of breath, he will have some discomfort in the right leg primarily between the knee and the ankle.  The patient has a chronic right foot drop associated with an injury to the right leg that occurred 15 years ago.  The patient denies any significant low back pain but occasionally he may get a sharp pain in the back.  He denies neck pain or pain down the arms or any numbness in the feet or hands.  He denies issues controlling the bowels or the bladder.  He has not had a change in balance recently.  He has undergone vascular evaluation of the lower extremities and was told that these studies were unremarkable.  He is sent to this office for further evaluation.  Past Medical History:  Diagnosis Date  . Arthritis   . Chicken pox   . Colon polyps   . Coronary artery disease   . ED (erectile dysfunction)   . Essential hypertension   . Gout   . Heart disease   . Hyperlipidemia   . Kidney disease   . Seasonal allergies     Past Surgical History:  Procedure Laterality Date  . APPENDECTOMY    . CATARACT EXTRACTION Bilateral   . CORONARY ANGIOPLASTY WITH STENT PLACEMENT  2016  . Status post ballon angioplasty of the left circumflex and drug eluting stent  to the distal left main into the proximal LAD   03/14/2015    Family History  Problem Relation Age of Onset  . Lung cancer Mother   . Aneurysm Brother   . Cancer Paternal Grandfather   . Heart attack Maternal Grandfather     Social history:  reports that he has never smoked. He has never used smokeless tobacco. He reports current alcohol use of about 21.0 standard drinks of alcohol per week. He reports that he does not use drugs.  Medications:  Prior to Admission medications   Medication Sig Start Date End Date Taking? Authorizing Provider  allopurinol (ZYLOPRIM) 100 MG tablet Take 1 tablet (100 mg total) by mouth daily. 07/09/17  Yes Nafziger, Tommi Rumps, NP  amLODipine (NORVASC) 5 MG tablet Take 1 tablet (5 mg total) by mouth daily. 07/09/17  Yes Nafziger, Tommi Rumps, NP  aspirin EC 81 MG tablet Take 81 mg by mouth daily.   Yes [provider]  atorvastatin (LIPITOR) 80 MG tablet Take 1 tablet (80 mg total) by mouth daily. 07/09/17  Yes Nafziger, Tommi Rumps, NP  doxazosin (CARDURA) 2 MG tablet Take 1 tablet (2 mg total) by mouth daily. 06/22/19  Yes Adrian Prows, MD  ezetimibe (ZETIA) 10 MG tablet Take 10 mg by mouth daily after supper.  09/19/17  Yes [provider]  ferrous sulfate 325 (65 FE) MG tablet Take 325 mg by mouth every other day.   Yes [provider]  furosemide (LASIX) 20 MG tablet Take 1 tablet (20 mg total) by mouth daily as needed for edema (Leg swelling). 01/14/20 04/13/20 Yes Adrian Prows, MD  loratadine (CLARITIN) 10 MG tablet Take 10 mg by mouth daily as needed for allergies.    Yes [provider]  losartan (COZAAR) 100 MG tablet Take 1 tablet (100 mg total) by mouth daily. 10/01/17  Yes Nafziger, Tommi Rumps, NP  metoprolol succinate (TOPROL-XL) 25 MG 24 hr tablet Take 25 mg by mouth daily.  09/19/17  Yes [provider]  nitroGLYCERIN (NITROSTAT) 0.4 MG SL tablet Place 0.4 mg under the tongue every 5 (five) minutes as needed.  09/19/17  Yes [provider]  zolpidem (AMBIEN) 5 MG tablet Take 5 mg by mouth at bedtime as needed for sleep.   Yes [provider]     No Known Allergies  ROS:  Out of a complete 14 system review of symptoms, the patient complains only of the following symptoms, and all other reviewed systems are negative.  Leg pain with walking Right knee arthritis Right foot drop  Blood pressure 131/65, pulse 67, height 5\' 11"  (1.803 m), weight 216 lb (98 kg).  Physical Exam  General: The patient is alert and cooperative at the time of the examination.  Eyes: Pupils are equal, round, and reactive to light. Discs are flat bilaterally.  Neck: The neck is supple, no carotid bruits are noted.  Respiratory: The respiratory examination is clear.  Cardiovascular: The cardiovascular examination reveals a regular rate and rhythm, no obvious murmurs or rubs are noted.  Skin: Extremities are without significant edema.  Neurologic Exam  Mental status: The patient is alert and oriented x 3 at the time of the examination. The patient has apparent normal recent and remote memory, with an apparently normal attention span and concentration ability.  Cranial nerves: Facial symmetry is present. There is good sensation of the face to pinprick and soft touch bilaterally. The strength of the facial muscles and the muscles to head turning and shoulder shrug are normal bilaterally. Speech is well enunciated, no aphasia or dysarthria is noted. Extraocular movements are full.  On primary gaze, there is medial deviation of the right eye.  Visual fields are full. The tongue is midline, and the patient has symmetric elevation of the soft palate. No obvious hearing deficits are noted.  Motor: The motor testing reveals 5 over 5 strength of all 4 extremities, with exception that there is a mild foot drop on the right.  Good symmetric motor tone is noted throughout.  Sensory: Sensory testing is intact to pinprick, soft touch,  vibration sensation, and position sense on all 4 extremities.  No stocking pattern pinprick sensory deficit was noted on either leg.  No evidence of extinction is noted.  Coordination: Cerebellar testing reveals good finger-nose-finger and heel-to-shin bilaterally.  Gait and station: Gait is mildly wide-based, the patient walk independently.  Tandem gait is unsteady.  Romberg is negative.  Reflexes: Deep tendon reflexes are symmetric, but are somewhat depressed bilaterally. Toes are downgoing bilaterally.   Assessment/Plan:  1.  Pseudoclaudication syndrome  2.  Chronic right foot drop  The patient is reporting discomfort in the lower extremities, right greater than left with walking that has been present for 3 to 4 years, gradually getting worse.  The distance that he has  to walk to get pain has shortened over time.  The clinical syndrome is suggestive of spinal stenosis of the lumbar spine.  Vascular studies on the lower extremities do not suggest significant peripheral vascular disease.  We will set the patient up for MRI of the lumbar spine, if this is unrevealing we will consider EMG and nerve conduction study evaluation.  Jill Alexanders MD 01/29/2020 11:11 AM  [facility] [address] [place], [place] [address]  Phone [phone] Fax [phone]

## 2020-02-01 ENCOUNTER — Other Ambulatory Visit: Payer: Self-pay

## 2020-02-01 ENCOUNTER — Telehealth: Payer: Self-pay | Admitting: Neurology

## 2020-02-01 ENCOUNTER — Ambulatory Visit: Payer: Medicare Other

## 2020-02-01 DIAGNOSIS — I25119 Atherosclerotic heart disease of native coronary artery with unspecified angina pectoris: Secondary | ICD-10-CM | POA: Diagnosis not present

## 2020-02-01 NOTE — Telephone Encounter (Signed)
Medicare/tricare order sent to GI. No auth they will reach out to the patient to schedule.  

## 2020-02-09 ENCOUNTER — Ambulatory Visit
Admission: RE | Admit: 2020-02-09 | Discharge: 2020-02-09 | Disposition: A | Discharge status: Home / Self Care | Payer: Medicare Other | Source: Ambulatory Visit | Attending: Neurology | Admitting: Neurology

## 2020-02-09 ENCOUNTER — Other Ambulatory Visit: Payer: Self-pay

## 2020-02-09 DIAGNOSIS — M48062 Spinal stenosis, lumbar region with neurogenic claudication: Secondary | ICD-10-CM

## 2020-02-11 ENCOUNTER — Telehealth: Payer: Self-pay | Admitting: Neurology

## 2020-02-11 DIAGNOSIS — M5431 Sciatica, right side: Secondary | ICD-10-CM

## 2020-02-11 NOTE — Telephone Encounter (Signed)
  I called the patient.  MRI of the lumbar spine does show some degenerative changes but no evidence of severe spinal stenosis that would explain a pseudoclaudication syndrome.  I will get the patient set up for EMG nerve conduction study evaluation.  The patient reports pain primarily down into the right leg with walking, he does have severe neuroforaminal stenosis on the right at the L4-5 level.  We may consider an epidural steroid injection in the future.  We will do nerve conduction studies on both legs, EMG on the right leg.  MRI lumbar 02/11/20:  IMPRESSION:   MRI lumbar spine (without) demonstrating: - At L3-4: disc bulging and facet hypertrophy with mild spinal stenosis and mild biforaminal stenosis. - At L4-5: disc bulging and facet hypertrophy with severe right and moderate left foraminal stenosis. - At L5-S1: disc bulging and facet hypertrophy with moderate right and mild left foraminal stenosis. - Rotoscoliosis and degenerative changes as above.

## 2020-02-24 ENCOUNTER — Ambulatory Visit: Payer: TRICARE For Life (TFL) | Admitting: Cardiology

## 2020-02-24 NOTE — Progress Notes (Deleted)
Primary Physician/Referring:  [name], MD  Patient ID: Kevin Levine, male    DOB: Jun 19, 1932, 86 y.o.   MRN: [id number]  No chief complaint on file.  HPI:    Kevin Levine  is a 86 y.o. Caucasian male with CAD status post balloon angioplasty of left circumflex and stenting to distal left main and the proximal LAD in September 2016, hypertension, hyperlipidemia, stable stage 3 chronic kidney disease, and gout, chronic leg edema and uses support stockings regularly.  He was seen by me on 01/14/2020 with worsening fatigue and decreased exercise tolerance and dyspnea on exertion.  BNP was normal.  He underwent nuclear stress test and echocardiogram.  He did not want to keep up with his appointment for venous insufficiency study at [facility].  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    Past Medical History:  Diagnosis Date  . Arthritis   . Chicken pox   . Colon polyps   . Coronary artery disease   . ED (erectile dysfunction)   . Essential hypertension   . Gout   . Heart disease   . Hyperlipidemia   . Kidney disease   . Seasonal allergies    Past Surgical History:  Procedure Laterality Date  . APPENDECTOMY    . CATARACT EXTRACTION Bilateral   . CORONARY ANGIOPLASTY WITH STENT PLACEMENT  2016  . Status post ballon angioplasty of the left circumflex and drug eluting stent to the distal left main into the proximal LAD   03/14/2015   Social History   Tobacco Use  . Smoking status: Never Smoker  . Smokeless tobacco: Never Used  Substance Use Topics  . Alcohol use: Yes    Alcohol/week: 21.0 standard drinks    Types: 14 Glasses of wine, 7 Standard drinks or equivalent per week   Marital Status: Married  ROS  Review of Systems  Constitutional: Positive for malaise/fatigue.  Cardiovascular: Positive for dyspnea on exertion. Negative for chest pain and leg swelling.  Gastrointestinal:  Negative for melena.   Objective  There were no vitals taken for this visit.  Vitals with BMI 01/29/2020 01/14/2020 11/17/2019  Height _0  _1  _2   Weight 216 lbs 218 lbs 218 lbs  BMI 30.14 97.41 63.84  Systolic 536 468 032  Diastolic 65 71 65  Pulse 67 63 63     Physical Exam Constitutional:      General: He is not in acute distress.    Appearance: He is well-developed.  Neck:     Vascular: No JVD.  Cardiovascular:     Rate and Rhythm: Normal rate and regular rhythm.     Pulses: Intact distal pulses.     Heart sounds: Normal heart sounds. No murmur heard.  No gallop.      Comments: Varicose veins mild noted bilateral. 2+ bilateral pitting edema, no JVD. Pulmonary:     Effort: Pulmonary effort is normal.     Breath sounds: Normal breath sounds.  Abdominal:     General: Bowel sounds are normal.     Palpations: Abdomen is soft.    Laboratory examination:   BNP    Component Value Date/Time   BNP 51.7 01/14/2020 1541   External labs :   Cholesterol, total 113.000 07/24/2019 HDL 49.000 07/24/2019 LDL-C 31.000 07/24/2019 Triglycerides 167.000 07/24/2019   A1C 6.200 07/24/2019 TSH 2.750 12/24/2019   Hemoglobin 12.400 12/24/2019  Creatinine, Serum  1.440 12/24/2019 Potassium 4.300 12/24/2019 Magnesium N/D ALT (SGPT) 18.000 03/30/2019   12/28/2017: Total cholesterol 113, triglycerides 77, HDL 47, LDL 51 mg. Serum glucose 112 mg, BUN 22, creatinine 1.64, eGFR 38/44 mL, potassium 4.4. CMP otherwise normal.  05/23/2017: Cholesterol 180, triglycerides 152, HDL 44, LDL 105. TSH 2.6. CBC normal. Creatinine 1.48, potassium 4.3, EGFR of 48, BMP otherwise normal. Hepatic function panel normal.  Medications and allergies  No Known Allergies   Current Outpatient Medications  Medication Instructions  . allopurinol (ZYLOPRIM) 100 mg, Oral, Daily  . amLODipine (NORVASC) 5 mg, Oral, Daily  . aspirin EC 81 mg, Oral, Daily  . atorvastatin (LIPITOR) 80 mg, Oral, Daily  . doxazosin  (CARDURA) 2 mg, Oral, Daily  . ezetimibe (ZETIA) 10 mg, Oral, Daily after supper  . ferrous sulfate 325 mg, Oral, Every other day  . furosemide (LASIX) 20 mg, Oral, Daily PRN  . loratadine (CLARITIN) 10 mg, Oral, Daily PRN  . losartan (COZAAR) 100 mg, Oral, Daily  . metoprolol succinate (TOPROL-XL) 25 mg, Oral, Daily  . nitroGLYCERIN (NITROSTAT) 0.4 mg, Sublingual, Every 5 min PRN  . zolpidem (AMBIEN) 5 mg, Oral, At bedtime PRN    Radiology:  No results found.  Cardiac Studies:   Coronary Angiography 11/01/2014: S/P resolute DES 3.5 x 18 mm stent into the distal left main into the LAD and balloon angioplasty to circumflex arteon   Echocardiogram 10/16/2017: Left ventricle cavity is normal in size. Mild concentric hypertrophy of the left ventricle. Normal global wall motion. Normal diastolic filling pattern. Calculated EF 55%. Mild tricuspid regurgitation. No evidence of pulmonary hypertension.  Exercise myoview stress 10/07/2017: 1. The patient performed treadmill exercise using a Bruce protocol, completing 4:59 minutes. The patient completed an estimated workload of 7.03 METS, reaching 86% of the maximum predicted heart rate. No stress symptoms reported. Exercise capacity is fair. Normal hemodynamic response. Stress electrocardiogram showed no ischemic changes. 2. The overall quality of the study is excellent. There is no evidence of abnormal lung activity. Stress and rest SPECT images demonstrate homogeneous tracer distribution throughout the myocardium. Gated SPECT imaging reveals normal myocardial thickening and wall motion. The left ventricular ejection fraction was normal (53%).  3. Low risk study.  Abdominal aortic duplex 10/16/2017: Mild diffuse calcific plaque noted in abdominal aorta. An abdominal aortic aneurysm measuring 2.85 x 2.82 x 2.73 cm is seen in mid aorta. There is mild ectasia of the abdominal aorta. The bilateral iliac arteries are also dilated at 15 mm. Normal  velocity. Recheck in 2 years.  Pulse oximetry, overnight 03/12/2019: Study suggest very mild nocturnal hypoxemia.  ABI 11/17/2019: Right: Resting right ankle-brachial index is within normal range. No  evidence of significant right lower extremity arterial disease. The right  toe-brachial index is normal. RT great toe pressure = 151 mmHg.  Left: Resting left ankle-brachial index indicates noncompressible left  lower extremity arteries. The left toe-brachial index is normal. LT Great  toe pressure = 179 mmHg.   Exercise tetrofosmin stress test  02/01/2020: Normal ECG stress. The patient exercised for 4 minutes and 30 seconds of a Bruce protocol, achieving approximately 6.42 METs.  Normal BP response.  Low level exercise tolerance. PVC and V-Bigeminy and rare PVC in recovery.  Myocardial perfusion is normal. Overall LV systolic function is normal without regional wall motion abnormalities. Stress LV EF: 52%.  No significant change from 10/07/2017. No change in exercise tolerance. Low risk.   Echocardiogram 01/22/2020:  Normal LV systolic function with visual EF 50-55%. Left  ventricle cavity is normal in size. Mild left ventricular hypertrophy. Normal global wall motion. Indeterminate diastolic filling pattern, normal LAP. Calculated EF 47%.  Left atrial cavity is moderately dilated.  Mild to moderate tricuspid regurgitation. No evidence of pulmonary  hypertension. RVSP measures 33 mmHg.  IVC is dilated with a respiratory response of >50%.  Compared to previous study 10/16/2017 no significant change.  EKG  10/15/2019: NSR, LAE, LAD, LAFB. IRBBB. Normal QT. No ischemia.  No significant change from EKG 03/10/2019.  Assessment     ICD-10-CM   1. Atherosclerosis of native coronary artery of native heart with angina pectoris ([place])  I25.119   2. Dyspnea on exertion  R06.00   3. Malaise and fatigue  R53.81    R53.83   4. Bilateral leg edema  R60.0       No orders of the defined types were  placed in this encounter.   There are no discontinued medications.   Recommendations:   Kevin Levine  is a 86 y.o. Caucasian male with CAD status post balloon angioplasty of left circumflex and stenting to distal left main and the proximal LAD in September 2016, hypertension, hyperlipidemia, stable stage 3 chronic kidney disease, and gout, chronic leg edema and uses support stockings regularly.  He was seen by me on 01/14/2020 with worsening fatigue and decreased exercise tolerance and dyspnea on exertion.  BNP was normal.  I reviewed the results of the echocardiogram and stress test, there is no ischemia by nuclear stress test and there is no LV dilatation and backup cardiogram LVEF is preserved and overall test results are unchanged from 2019.  Suspect his dyspnea is related to age-related deconditioning as his exercise tolerance compared to 2019 stress EKG is essentially unremarkable.  With regard to daytime somnolence and fatigue, he may also have a component of sleep apnea or nocturnal hypoxemia.  I would still recommend either a nocturnal oximetry or a sleep evaluation. He prefers to wait on this for now.  With regard to leg edema, I deferred him to be evaluated by vein and vascular surgery, he canceled appointment stating he is stable and will continue to wear support stockings.  I will see him back in 1 year for follow-up.   Kevin Prows, MD, [facility] 02/24/2020, 7:08 AM Office: [phone]

## 2020-02-25 ENCOUNTER — Encounter: Payer: Self-pay | Admitting: Cardiology

## 2020-02-25 ENCOUNTER — Ambulatory Visit: Payer: Medicare Other | Admitting: Cardiology

## 2020-02-25 ENCOUNTER — Other Ambulatory Visit: Payer: Self-pay

## 2020-02-25 VITALS — BP 129/63 | HR 67 | Resp 16 | Ht 71.0 in | Wt 219.0 lb

## 2020-02-25 DIAGNOSIS — R5383 Other fatigue: Secondary | ICD-10-CM | POA: Diagnosis not present

## 2020-02-25 DIAGNOSIS — I25119 Atherosclerotic heart disease of native coronary artery with unspecified angina pectoris: Secondary | ICD-10-CM | POA: Diagnosis not present

## 2020-02-25 DIAGNOSIS — I878 Other specified disorders of veins: Secondary | ICD-10-CM

## 2020-02-25 DIAGNOSIS — R0609 Other forms of dyspnea: Secondary | ICD-10-CM

## 2020-02-25 DIAGNOSIS — R600 Localized edema: Secondary | ICD-10-CM | POA: Diagnosis not present

## 2020-02-25 DIAGNOSIS — R5381 Other malaise: Secondary | ICD-10-CM | POA: Diagnosis not present

## 2020-02-25 DIAGNOSIS — R400 Somnolence: Secondary | ICD-10-CM

## 2020-02-25 NOTE — Progress Notes (Signed)
Primary Physician/Referring:  [name], MD  Patient ID: Kevin Levine, male    DOB: 27-Mar-1932, 86 y.o.   MRN: [id number]  No chief complaint on file.  HPI:    Kevin Levine  is a 86 y.o. Caucasian male with CAD status post balloon angioplasty of left circumflex and stenting to distal left main and the proximal LAD in September 2016, hypertension, hyperlipidemia, stable stage 3 chronic kidney disease, and gout. He has chronic bilateral leg edema due to varicose veins, has been wearing support stockings, has started noticing fatigue and also leg cramps recently over the last few months.  He has mild abdominal aortic ectasia by duplex.   Presents for 6 week follow up on coronary artery disease, dyspnea, leg edema.  At last visit started on furosemide 20 mg daily as needed for edema. He is tolerating the furosemide well.  He reports symptoms of fatigue, daytime sleepiness, dyspnea, and swelling have all improved since last visit.  Reports he still has some mild leg pain when walking.  He states this does not significantly limit his daily activity, he continues to walk on a regular basis.  He states that he snores and often has difficulty sleeping.   Past Medical History:  Diagnosis Date  . Arthritis   . Chicken pox   . Colon polyps   . Coronary artery disease   . ED (erectile dysfunction)   . Essential hypertension   . Gout   . Heart disease   . Hyperlipidemia   . Kidney disease   . Seasonal allergies    Past Surgical History:  Procedure Laterality Date  . APPENDECTOMY    . CATARACT EXTRACTION Bilateral   . CORONARY ANGIOPLASTY WITH STENT PLACEMENT  2016  . Status post ballon angioplasty of the left circumflex and drug eluting stent to the distal left main into the proximal LAD   03/14/2015   Social History   Tobacco Use  . Smoking status: Never Smoker  . Smokeless tobacco: Never Used  Substance Use Topics  . Alcohol use: Yes    Alcohol/week: 21.0 standard drinks     Types: 14 Glasses of wine, 7 Standard drinks or equivalent per week   Marital Status: Married  ROS  Review of Systems  Constitutional: Positive for malaise/fatigue (imrpoved).  Cardiovascular: Positive for dyspnea on exertion (improved). Negative for chest pain, leg swelling, orthopnea, palpitations, paroxysmal nocturnal dyspnea and syncope.  Gastrointestinal: Negative for melena.  Neurological: Negative for dizziness.   Objective  There were no vitals taken for this visit.  Vitals with BMI 01/29/2020 01/14/2020 11/17/2019  Height _0  _1  _2   Weight 216 lbs 218 lbs 218 lbs  BMI 30.14 32.99 24.26  Systolic 834 196 222  Diastolic 65 71 65  Pulse 67 63 63     Physical Exam Constitutional:      General: He is not in acute distress.    Appearance: He is well-developed.  Neck:     Vascular: No JVD.  Cardiovascular:     Rate and Rhythm: Normal rate and regular rhythm.     Pulses: Intact distal pulses.     Heart sounds: Normal heart sounds. No murmur heard.  No gallop.      Comments: Trace edema bilaterally. No JVD. Pulmonary:     Effort: Pulmonary effort is normal.     Breath sounds: Normal breath sounds.    Laboratory examination:   CMP Latest Ref Rng & Units 05/23/2017 05/23/2017 12/07/2016  Glucose 70 -  99 mg/dL 109(H) - -  BUN 6 - 23 mg/dL 25(H) - 23(A)  Creatinine 0.40 - 1.50 mg/dL 1.48 1.5(A) 1.5(A)  Sodium 135 - 145 mEq/L 138 - 137  Potassium 3.5 - 5.1 mEq/L 4.3 4.3 4.3  Chloride 96 - 112 mEq/L 103 - -  CO2 19 - 32 mEq/L 27 - -  Calcium 8.4 - 10.5 mg/dL 9.9 - -  Total Protein 6.0 - 8.3 g/dL 7.8 - -  Total Bilirubin 0.2 - 1.2 mg/dL 0.8 - -  Alkaline Phos 39 - 117 U/L 68 - -  AST 0 - 37 U/L 19 - 21  ALT 0 - 53 U/L 16 - 17   CBC Latest Ref Rng & Units 05/23/2017  WBC 4.0 - 10.5 K/uL 7.4  Hemoglobin 13.0 - 17.0 g/dL 13.8  Hematocrit 39 - 52 % 41.7  Platelets 150 - 400 K/uL 274.0   Lipid Panel     Component Value Date/Time   CHOL 180 05/23/2017 1107    TRIG 152.0 (H) 05/23/2017 1107   HDL 44.60 05/23/2017 1107   CHOLHDL 4 05/23/2017 1107   VLDL 30.4 05/23/2017 1107   LDLCALC 105 (H) 05/23/2017 1107   HEMOGLOBIN A1C No results found for: HGBA1C, MPG TSH No results for input(s): TSH in the last 8760 hours.  BNP (last 3 results) Recent Labs    01/14/20 1541  BNP 51.7    ProBNP (last 3 results) No results for input(s): PROBNP in the last 8760 hours.  External labs :   Cholesterol, total 113.000 07/24/2019 HDL 49.000 07/24/2019 LDL-C 31.000 07/24/2019 Triglycerides 167.000 07/24/2019   A1C 6.200 07/24/2019 TSH 2.750 12/24/2019   Hemoglobin 12.400 12/24/2019  Creatinine, Serum 1.440 12/24/2019 Potassium 4.300 12/24/2019 Magnesium N/D ALT (SGPT) 18.000 03/30/2019   12/28/2017: Total cholesterol 113, triglycerides 77, HDL 47, LDL 51 mg. Serum glucose 112 mg, BUN 22, creatinine 1.64, eGFR 38/44 mL, potassium 4.4. CMP otherwise normal.  05/23/2017: Cholesterol 180, triglycerides 152, HDL 44, LDL 105. TSH 2.6. CBC normal. Creatinine 1.48, potassium 4.3, EGFR of 48, BMP otherwise normal. Hepatic function panel normal.  Medications and allergies  No Known Allergies   Current Outpatient Medications  Medication Instructions  . allopurinol (ZYLOPRIM) 100 mg, Oral, Daily  . amLODipine (NORVASC) 5 mg, Oral, Daily  . aspirin EC 81 mg, Oral, Daily  . atorvastatin (LIPITOR) 80 mg, Oral, Daily  . doxazosin (CARDURA) 2 mg, Oral, Daily  . ezetimibe (ZETIA) 10 mg, Oral, Daily after supper  . ferrous sulfate 325 mg, Oral, Every other day  . furosemide (LASIX) 20 mg, Oral, Daily PRN  . loratadine (CLARITIN) 10 mg, Oral, Daily PRN  . losartan (COZAAR) 100 mg, Oral, Daily  . metoprolol succinate (TOPROL-XL) 25 mg, Oral, Daily  . nitroGLYCERIN (NITROSTAT) 0.4 mg, Sublingual, Every 5 min PRN  . zolpidem (AMBIEN) 5 mg, Oral, At bedtime PRN    Radiology:  No results found.  Cardiac Studies:   Coronary Angiography 11/01/2014: S/P resolute DES  3.5 x 18 mm stent into the distal left main into the LAD and balloon angioplasty to circumflex arteon   Echocardiogram 10/16/2017: Left ventricle cavity is normal in size. Mild concentric hypertrophy of the left ventricle. Normal global wall motion. Normal diastolic filling pattern. Calculated EF 55%. Mild tricuspid regurgitation. No evidence of pulmonary hypertension.  Exercise myoview stress 10/07/2017: 1. The patient performed treadmill exercise using a Bruce protocol, completing 4:59 minutes. The patient completed an estimated workload of 7.03 METS, reaching 86% of the maximum predicted  heart rate. No stress symptoms reported. Exercise capacity is fair. Normal hemodynamic response. Stress electrocardiogram showed no ischemic changes. 2. The overall quality of the study is excellent. There is no evidence of abnormal lung activity. Stress and rest SPECT images demonstrate homogeneous tracer distribution throughout the myocardium. Gated SPECT imaging reveals normal myocardial thickening and wall motion. The left ventricular ejection fraction was normal (53%).  3. Low risk study.  Abdominal aortic duplex 10/16/2017: Mild diffuse calcific plaque noted in abdominal aorta. An abdominal aortic aneurysm measuring 2.85 x 2.82 x 2.73 cm is seen in mid aorta. There is mild ectasia of the abdominal aorta. The bilateral iliac arteries are also dilated at 15 mm. Normal velocity. Recheck in 2 years.  Pulse oximetry, overnight 03/12/2019: Study suggest very mild nocturnal hypoxemia.  ABI 11/17/2019: Right: Resting right ankle-brachial index is within normal range. No  evidence of significant right lower extremity arterial disease. The right  toe-brachial index is normal. RT great toe pressure = 151 mmHg.  Left: Resting left ankle-brachial index indicates noncompressible left  lower extremity arteries. The left toe-brachial index is normal. LT Great  toe pressure = 179 mmHg.   Echocardiogram 01/22/2020:    Normal LV systolic function with visual EF 50-55%. Left ventricle cavity is normal in size. Mild left ventricular hypertrophy. Normal global wall motion. Indeterminate diastolic filling pattern, normal LAP. Calculated EF 47%.  Left atrial cavity is moderately dilated.  Mild to moderate tricuspid regurgitation. No evidence of pulmonary hypertension. RVSP measures 33 mmHg.  IVC is dilated with a respiratory response of >50%.  Compared to previous study 10/16/2017 no significant change.   Exercise tetrofosmin stress test  02/01/2020: Normal ECG stress. The patient exercised for 4 minutes and 30 seconds of a Bruce protocol, achieving approximately 6.42 METs.  Normal BP response.  Low level exercise tolerance. PVC and V-Bigeminy and rare PVC in recovery.  Myocardial perfusion is normal. Overall LV systolic function is normal without regional wall motion abnormalities. Stress LV EF: 52%.  No significant change from 10/07/2017. No change in exercise tolerance. Low risk.  EKG EKG  10/15/2019: NSR, LAE, LAD, LAFB. IRBBB. Normal QT. No ischemia.  No significant change from EKG 03/10/2019.  Assessment     ICD-10-CM   1. Malaise and fatigue  R53.81    R53.83   2. Atherosclerosis of native coronary artery of native heart with angina pectoris ([place])  I25.119   3. Dyspnea on exertion  R06.00   4. Bilateral leg edema  R60.0   5. Daytime somnolence  R40.0   6. Venous intermittent claudication  I87.8       No orders of the defined types were placed in this encounter.   There are no discontinued medications.   Recommendations:   Nickoli Bagheri  is a 86 y.o. Caucasian male with CAD status post balloon angioplasty of left circumflex and stenting to distal left main and the proximal LAD in September 2016, hypertension, hyperlipidemia, stable stage 3 chronic kidney disease, and gout.   Informed patient of results of echocardiogram and stress test, both of which have no significant change from previous  study.  Discussed with patient it is unlikely that his increased fatigue and decreased exercise tolerance are related to cardiac etiology, as evidenced by echocardiogram and stress test.   Dicussed with patient the options of referral for sleep study for daytime somnolence, and referral for venous insufficiency evaluation for claudication symptoms. As his symptoms have improved and do not limit is daily activity, patient  prefers not to go ahead with referrals at this time. In view of his continued mild daytime somnolence will obtain nocturnal pulse oximetry. Lipids and blood pressure are well controlled, will continue current medical therapy.    Follow-up in 6 months for coronary artery disease.   Patient was seen in collaboration with Dr. Einar Gip. He also reviewed patient's chart and examined the patient. Dr. Einar Gip is in agreement of the plan.     Alethia Berthold, MD, [facility] 02/25/2020, 1:53 PM Office: [phone]

## 2020-02-26 ENCOUNTER — Ambulatory Visit: Payer: TRICARE For Life (TFL) | Admitting: Cardiology

## 2020-03-21 ENCOUNTER — Ambulatory Visit (INDEPENDENT_AMBULATORY_CARE_PROVIDER_SITE_OTHER): Payer: Medicare Other | Admitting: Neurology

## 2020-03-21 ENCOUNTER — Encounter: Payer: Self-pay | Admitting: Neurology

## 2020-03-21 DIAGNOSIS — I739 Peripheral vascular disease, unspecified: Secondary | ICD-10-CM

## 2020-03-21 DIAGNOSIS — M5431 Sciatica, right side: Secondary | ICD-10-CM

## 2020-03-21 NOTE — Progress Notes (Addendum)
The patient has come in for EMG and nerve conduction study evaluation today.  He has a prior history of a foot drop 15 years ago.  The nerve conductions show evidence of a severe right peroneal neuropathy.  EMG evaluation of the right lower extremity shows findings consistent with this, no clear evidence of an overlying lumbosacral radiculopathy was seen.  MRI of the lumbar spine shows severe right-sided neuroforaminal stenosis at the L4-5 level and moderate foraminal stenosis on the right at the L5-S1 level.  May consider an epidural steroid injection for diagnostic and therapeutic value.  If this is done and the patient improves with his symptoms, this could potentially be the source of his discomfort.  No evidence of spinal stenosis was seen.    Fellows    Nerve / Sites Muscle Latency Ref. Amplitude Ref. Rel Amp Segments Distance Velocity Ref. Area    ms ms mV mV %  cm m/s m/s mVms  L Peroneal - EDB     Ankle EDB 4.9 ?6.5 2.8 ?2.0 100 Ankle - EDB 9   7.1     Fib head EDB 12.3  2.5  88.1 Fib head - Ankle 30 41 ?44 7.8     Pop fossa EDB 14.8  2.3  92.4 Pop fossa - Fib head 10 40 ?44 7.5         Pop fossa - Ankle      R Peroneal - EDB     Ankle EDB NR ?6.5 NR ?2.0 NR Ankle - EDB 9   NR     Fib head EDB NR  NR  NR Fib head - Ankle 30 NR ?44 NR     Pop fossa EDB NR  NR  NR Pop fossa - Fib head 10 NR ?44 NR         Pop fossa - Ankle      L Tibial - AH     Ankle AH 3.8 ?5.8 7.6 ?4.0 100 Ankle - AH 9   19.2     Pop fossa AH 13.5  6.7  87.8 Pop fossa - Ankle 40 41 ?41 18.9  R Tibial - AH     Ankle AH 4.5 ?5.8 3.8 ?4.0 100 Ankle - AH 9   9.7     Pop fossa AH 14.3  1.6  41.5 Pop fossa - Ankle 40 41 ?41 2.8             SNC    Nerve / Sites Rec. Site Peak Lat Ref.  Amp Ref. Segments Distance    ms ms V V  cm  L Sural - Ankle (Calf)     Calf Ankle 4.4 ?4.4 2 ?6 Calf - Ankle 14  R Sural - Ankle (Calf)     Calf Ankle 4.0 ?4.4 2 ?6 Calf - Ankle 14  L Superficial peroneal - Ankle     Lat leg Ankle  3.7 ?4.4 2 ?6 Lat leg - Ankle 14  R Superficial peroneal - Ankle     Lat leg Ankle NR ?4.4 NR ?6 Lat leg - Ankle 14             F  Wave    Nerve F Lat Ref.   ms ms  L Tibial - AH 53.4 ?56.0  R Tibial - AH 61.4 ?56.0

## 2020-03-21 NOTE — Procedures (Signed)
     HISTORY:  Kevin Levine is an 86 year old gentleman with a history of a prior right foot drop 15 years ago.  He has had some discomfort in the legs with prolonged walking, right greater than left.  MRI of the lumbar spine has not shown evidence of spinal stenosis.  The patient is being evaluated for a possible lumbar radiculopathy.  NERVE CONDUCTION STUDIES:  Nerve conduction studies were performed on both lower extremities.  The distal motor latencies for the left peroneal nerve and for the posterior tibial nerves bilaterally were normal, no response was seen for the right peroneal nerve.  The motor amplitudes for the posterior tibial nerves were low on the right and normal on the left with normal nerve conduction velocities seen for these nerves.  The motor amplitude for the left peroneal nerve was normal but with slowing above and below the fibular head.  The sensory latencies for the sural nerves bilaterally and for the left peroneal nerve were normal, no response was seen for the right peroneal nerve.  The F-wave latencies for the posterior tibial nerves were normal on the left and prolonged on the right.  EMG STUDIES:  EMG study was performed on the right lower extremity:  The tibialis anterior muscle reveals 1 to 3K motor units with significantly reduced recruitment. 2+ fibrillations and positive waves were seen. The peroneus tertius muscle reveals no voluntary motor units with no recruitment. No fibrillations or positive waves were seen. The medial gastrocnemius muscle reveals 1 to 3K motor units with slightly reduced recruitment. No fibrillations or positive waves were seen. The vastus lateralis muscle reveals 2 to 4K motor units with full recruitment. No fibrillations or positive waves were seen. The iliopsoas muscle reveals 2 to 4K motor units with full recruitment. No fibrillations or positive waves were seen. The biceps femoris muscle (long head) reveals 2 to 4K motor units  with full recruitment. No fibrillations or positive waves were seen. The lumbosacral paraspinal muscles were tested at 3 levels, and revealed no abnormalities of insertional activity at all 3 levels tested. There was good relaxation.   IMPRESSION:  Nerve conduction studies done on both lower extremities shows evidence of a severe right peroneal neuropathy at the fibular head.  Some slowing was also seen for the left peroneal nerve.  EMG evaluation of the right lower extremity shows findings consistent with peroneal neuropathy without evidence of an overlying lumbosacral radiculopathy.  Jill Alexanders MD 03/21/2020 2:30 PM  [facility] [address] [place], [place] [address]  Phone [phone] Fax [phone]

## 2020-03-21 NOTE — Progress Notes (Signed)
Please refer to EMG and nerve conduction procedure note.  

## 2020-03-22 ENCOUNTER — Telehealth: Payer: Self-pay | Admitting: Neurology

## 2020-03-22 NOTE — Telephone Encounter (Signed)
Pt's son, Keshan Reha (on [place]) called would like to discuss father's condition so I can explain to him. Would like a call from the nurse.

## 2020-03-22 NOTE — Telephone Encounter (Signed)
I called and talk with the patient's son.  I discussed the recommendations concerning the epidural steroid injection of the low back.  The patient may be amenable to getting the epidural, his son will discuss this with him and the patient will call me if he wants to go ahead and get the procedure done.

## 2020-03-22 NOTE — Telephone Encounter (Signed)
Hello Dr Jannifer Franklin,  Spoke to Janeth Rase (Patient's son).  He is a Psychologist, sport and exercise and has reviewed the EMG.  He would like to discuss what the plan is for his dad.  You may call him at [phone].  Thank you.

## 2020-04-06 DIAGNOSIS — M109 Gout, unspecified: Secondary | ICD-10-CM | POA: Diagnosis not present

## 2020-04-06 DIAGNOSIS — I10 Essential (primary) hypertension: Secondary | ICD-10-CM | POA: Diagnosis not present

## 2020-04-06 DIAGNOSIS — Z0000 Encounter for general adult medical examination without abnormal findings: Secondary | ICD-10-CM | POA: Diagnosis not present

## 2020-04-06 DIAGNOSIS — I714 Abdominal aortic aneurysm, without rupture: Secondary | ICD-10-CM | POA: Diagnosis not present

## 2020-04-06 DIAGNOSIS — N183 Chronic kidney disease, stage 3 unspecified: Secondary | ICD-10-CM | POA: Diagnosis not present

## 2020-04-06 DIAGNOSIS — Z23 Encounter for immunization: Secondary | ICD-10-CM | POA: Diagnosis not present

## 2020-04-06 DIAGNOSIS — I2510 Atherosclerotic heart disease of native coronary artery without angina pectoris: Secondary | ICD-10-CM | POA: Diagnosis not present

## 2020-04-06 DIAGNOSIS — Z1389 Encounter for screening for other disorder: Secondary | ICD-10-CM | POA: Diagnosis not present

## 2020-04-06 DIAGNOSIS — R7309 Other abnormal glucose: Secondary | ICD-10-CM | POA: Diagnosis not present

## 2020-04-06 DIAGNOSIS — E7800 Pure hypercholesterolemia, unspecified: Secondary | ICD-10-CM | POA: Diagnosis not present

## 2020-04-06 DIAGNOSIS — M1990 Unspecified osteoarthritis, unspecified site: Secondary | ICD-10-CM | POA: Diagnosis not present

## 2020-04-06 DIAGNOSIS — R7303 Prediabetes: Secondary | ICD-10-CM | POA: Diagnosis not present

## 2020-04-10 MED ORDER — METHYLPREDNISOLONE ACETATE 40 MG/ML IJ SUSP
40.0000 mg | INTRAMUSCULAR | Status: AC | PRN
  Administered 2019-12-29: 40 mg via INTRA_ARTICULAR

## 2020-04-10 MED ORDER — LIDOCAINE HCL 1 % IJ SOLN
5.0000 mL | INTRAMUSCULAR | Status: AC | PRN
  Administered 2019-12-29: 5 mL

## 2020-04-10 MED ORDER — BUPIVACAINE HCL 0.25 % IJ SOLN
4.0000 mL | INTRAMUSCULAR | Status: AC | PRN
  Administered 2019-12-29: 4 mL via INTRA_ARTICULAR

## 2020-05-03 ENCOUNTER — Other Ambulatory Visit: Payer: Self-pay | Admitting: Internal Medicine

## 2020-05-03 DIAGNOSIS — Z8249 Family history of ischemic heart disease and other diseases of the circulatory system: Secondary | ICD-10-CM | POA: Diagnosis not present

## 2020-05-03 DIAGNOSIS — R9431 Abnormal electrocardiogram [ECG] [EKG]: Secondary | ICD-10-CM | POA: Diagnosis not present

## 2020-05-03 DIAGNOSIS — R55 Syncope and collapse: Secondary | ICD-10-CM | POA: Diagnosis not present

## 2020-05-04 ENCOUNTER — Encounter: Payer: Self-pay | Admitting: Cardiology

## 2020-05-04 ENCOUNTER — Ambulatory Visit: Payer: Medicare Other

## 2020-05-04 ENCOUNTER — Ambulatory Visit: Payer: Medicare Other | Admitting: Cardiology

## 2020-05-04 ENCOUNTER — Other Ambulatory Visit: Payer: Self-pay

## 2020-05-04 VITALS — BP 145/64 | HR 65 | Resp 16 | Ht 71.0 in | Wt 216.0 lb

## 2020-05-04 DIAGNOSIS — R55 Syncope and collapse: Secondary | ICD-10-CM

## 2020-05-04 DIAGNOSIS — I452 Bifascicular block: Secondary | ICD-10-CM

## 2020-05-04 DIAGNOSIS — I25119 Atherosclerotic heart disease of native coronary artery with unspecified angina pectoris: Secondary | ICD-10-CM | POA: Diagnosis not present
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
## 2020-05-07 DIAGNOSIS — R55 Syncope and collapse: Secondary | ICD-10-CM | POA: Diagnosis not present

## 2020-05-10 ENCOUNTER — Telehealth: Payer: Self-pay

## 2020-05-10 NOTE — Telephone Encounter (Signed)
Patient called and has several questions about having a pacemaker placed. He would like for you to please give him a call as soon as possible.

## 2020-05-12 NOTE — Telephone Encounter (Signed)
I left message and also sent my chart message

## 2020-05-23 ENCOUNTER — Ambulatory Visit
Admission: RE | Admit: 2020-05-23 | Discharge: 2020-05-23 | Disposition: A | Discharge status: Home / Self Care | Payer: Medicare Other | Source: Ambulatory Visit | Attending: Internal Medicine | Admitting: Internal Medicine

## 2020-05-23 ENCOUNTER — Telehealth: Payer: Self-pay

## 2020-05-23 ENCOUNTER — Other Ambulatory Visit: Payer: Self-pay

## 2020-05-23 DIAGNOSIS — R55 Syncope and collapse: Secondary | ICD-10-CM | POA: Diagnosis not present

## 2020-05-23 DIAGNOSIS — Z8249 Family history of ischemic heart disease and other diseases of the circulatory system: Secondary | ICD-10-CM

## 2020-05-23 DIAGNOSIS — D1779 Benign lipomatous neoplasm of other sites: Secondary | ICD-10-CM | POA: Diagnosis not present

## 2020-05-23 DIAGNOSIS — D170 Benign lipomatous neoplasm of skin and subcutaneous tissue of head, face and neck: Secondary | ICD-10-CM | POA: Diagnosis not present

## 2020-05-23 DIAGNOSIS — I6782 Cerebral ischemia: Secondary | ICD-10-CM | POA: Diagnosis not present

## 2020-05-23 DIAGNOSIS — G319 Degenerative disease of nervous system, unspecified: Secondary | ICD-10-CM | POA: Diagnosis not present
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
## 2020-05-23 MED ORDER — GADOBENATE DIMEGLUMINE 529 MG/ML IV SOLN
20.0000 mL | Freq: Once | INTRAVENOUS | Status: AC | PRN
  Administered 2020-05-23: 20 mL via INTRAVENOUS

## 2020-05-23 NOTE — Telephone Encounter (Signed)
Patient called requesting results from his heart monitor. 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 He needs to know if you can see him ASAP because he needs to be clear

## 2020-05-23 NOTE — Telephone Encounter (Signed)
He only wore the monitor for 2 days instead of 2 weeks. He cannot be driving for 6 months according to the rules and this could happen again. Another option is to implant a loop recorder and continuously monitor him for 6 months and if no events after 4-5 months, he can then resume. He should also discuss with his son.  I am happy to see him tomorrow at 12:30 PM

## 2020-05-23 NOTE — Telephone Encounter (Signed)
Dr. Einar Gip : Patient says that this information is incorrect because he is stating that the first monitor fell off after two days and he came back that day and had another one put on. 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  He can be here tomorrow at 35 :30p.   Front staff: Can you please add patient to Dr. Irven Shelling schedule for tomorrow at 12:30p, patient is aware.

## 2020-05-24 ENCOUNTER — Encounter: Payer: Self-pay | Admitting: Cardiology

## 2020-05-24 ENCOUNTER — Telehealth: Payer: Self-pay | Admitting: Cardiology

## 2020-05-24 ENCOUNTER — Other Ambulatory Visit: Payer: Self-pay

## 2020-05-24 ENCOUNTER — Ambulatory Visit: Payer: Medicare Other | Admitting: Cardiology

## 2020-05-24 VITALS — BP 155/78 | HR 90 | Ht 71.0 in | Wt 214.6 lb

## 2020-05-24 DIAGNOSIS — I25119 Atherosclerotic heart disease of native coronary artery with unspecified angina pectoris: Secondary | ICD-10-CM

## 2020-05-24 DIAGNOSIS — I452 Bifascicular block: Secondary | ICD-10-CM

## 2020-05-24 DIAGNOSIS — I10 Essential (primary) hypertension: Secondary | ICD-10-CM | POA: Diagnosis not present

## 2020-05-24 DIAGNOSIS — R55 Syncope and collapse: Secondary | ICD-10-CM | POA: Diagnosis not present

## 2020-05-24 MED ORDER — AMLODIPINE BESYLATE 10 MG PO TABS: 10.0000 mg | ORAL_TABLET | Freq: Every day | ORAL | 3 refills | Status: DC
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
## 2020-05-26 ENCOUNTER — Other Ambulatory Visit: Payer: Self-pay | Admitting: Cardiology

## 2020-05-30 ENCOUNTER — Other Ambulatory Visit: Payer: Self-pay

## 2020-05-30 ENCOUNTER — Encounter: Payer: Self-pay | Admitting: Internal Medicine

## 2020-05-30 ENCOUNTER — Ambulatory Visit (INDEPENDENT_AMBULATORY_CARE_PROVIDER_SITE_OTHER): Payer: Medicare Other | Admitting: Internal Medicine

## 2020-05-30 VITALS — BP 104/52 | HR 76 | Ht 71.0 in | Wt 215.0 lb

## 2020-05-30 DIAGNOSIS — I452 Bifascicular block: Secondary | ICD-10-CM | POA: Diagnosis not present

## 2020-05-30 DIAGNOSIS — R55 Syncope and collapse: Secondary | ICD-10-CM
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
## 2020-05-30 NOTE — Patient Instructions (Addendum)
Medication Instructions:  1 Stop your Metoprolol Succinate   *If you need a refill on your cardiac medications before your next appointment, please call your pharmacy*  Lab Work: None ordered.  If you have labs (blood work) drawn today and your tests are completely normal, you will receive your results only by: Marland Kitchen MyChart Message (if you have MyChart) OR . A paper copy in the mail If you have any lab test that is abnormal or we need to change your treatment, we will call you to review the results.  Testing/Procedures: None ordered.  Follow-Up: At [facility], you and your health needs are our priority.  As part of our continuing mission to provide you with exceptional heart care, we have created designated Provider Care Teams.  These Care Teams include your primary Cardiologist (physician) and Advanced Practice Providers (APPs -  Physician Assistants and Nurse Practitioners) who all work together to provide you with the care you need, when you need it.  We recommend signing up for the patient portal called "MyChart".  Sign up information is provided on this After Visit Summary.  MyChart is used to connect with patients for Virtual Visits (Telemedicine).  Patients are able to view lab/test results, encounter notes, upcoming appointments, etc.  Non-urgent messages can be sent to your provider as well.   To learn more about what you can do with MyChart, go to [email or web address].    Your next appointment:   Your physician wants you to follow-up in: as needed    Other Instructions:

## 2020-06-02 DIAGNOSIS — R55 Syncope and collapse: Secondary | ICD-10-CM | POA: Diagnosis not present

## 2020-06-03 ENCOUNTER — Telehealth: Payer: Self-pay

## 2020-06-03 NOTE — Telephone Encounter (Signed)
Patient called and said he was returning your call???

## 2020-06-06 ENCOUNTER — Telehealth (HOSPITAL_COMMUNITY): Payer: Self-pay | Admitting: *Deleted

## 2020-06-06 ENCOUNTER — Telehealth: Payer: Self-pay

## 2020-06-06 NOTE — Telephone Encounter (Signed)
Pt left VM requesting a return call about scheduling an appt with our office asap for pacemaker implant. I called pt back and left detailed message informing patient he contacted the wrong office and to contact Dr.Allred at [phone] he saw pt on 12/6.

## 2020-06-06 NOTE — Telephone Encounter (Signed)
Patient called asking a lot questions about getting a pacemaker put in. He is getting anxious about time frame of getting this process moving. Please give him a call ASAP.

## 2020-06-06 NOTE — Telephone Encounter (Signed)
Discussed with the patient, will try to schedule for placement complication depending upon Dr. Jeneen Rinks Allred's schedule.  We will certainly discuss with him.  Patient appears anxious.  I reassured him.

## 2020-06-08 DIAGNOSIS — I714 Abdominal aortic aneurysm, without rupture: Secondary | ICD-10-CM | POA: Diagnosis not present

## 2020-06-08 NOTE — Telephone Encounter (Signed)
Patient scheduled for EP consult

## 2020-06-13 ENCOUNTER — Ambulatory Visit (INDEPENDENT_AMBULATORY_CARE_PROVIDER_SITE_OTHER): Payer: Medicare Other | Admitting: Internal Medicine

## 2020-06-13 ENCOUNTER — Encounter: Payer: Self-pay | Admitting: Internal Medicine

## 2020-06-13 ENCOUNTER — Other Ambulatory Visit: Payer: Self-pay

## 2020-06-13 ENCOUNTER — Encounter: Payer: Self-pay | Admitting: *Deleted

## 2020-06-13 ENCOUNTER — Telehealth: Payer: Self-pay | Admitting: Internal Medicine

## 2020-06-13 VITALS — BP 126/64 | HR 97 | Ht 71.0 in | Wt 217.8 lb

## 2020-06-13 DIAGNOSIS — R55 Syncope and collapse: Secondary | ICD-10-CM | POA: Diagnosis not present

## 2020-06-13 DIAGNOSIS — I25119 Atherosclerotic heart disease of native coronary artery with unspecified angina pectoris: Secondary | ICD-10-CM | POA: Diagnosis not present

## 2020-06-13 DIAGNOSIS — I452 Bifascicular block: Secondary | ICD-10-CM

## 2020-06-13 LAB — CBC WITH DIFFERENTIAL/PLATELET
Basophils Absolute: 0 10*3/uL (ref 0.0–0.2)
Basos: 1 %
EOS (ABSOLUTE): 0.3 10*3/uL (ref 0.0–0.4)
Eos: 4 %
Hematocrit: 38.3 % (ref 37.5–51.0)
Hemoglobin: 12.8 g/dL — ABNORMAL LOW (ref 13.0–17.7)
Lymphocytes Absolute: 1.4 10*3/uL (ref 0.7–3.1)
Lymphs: 18 %
MCH: 31.7 pg (ref 26.6–33.0)
MCHC: 33.4 g/dL (ref 31.5–35.7)
MCV: 95 fL (ref 79–97)
Monocytes Absolute: 0.9 10*3/uL (ref 0.1–0.9)
Monocytes: 11 %
Neutrophils Absolute: 5.1 10*3/uL (ref 1.4–7.0)
Neutrophils: 66 %
Platelets: 249 10*3/uL (ref 150–450)
RBC: 4.04 x10E6/uL — ABNORMAL LOW (ref 4.14–5.80)
RDW: 14 % (ref 11.6–15.4)
WBC: 7.7 10*3/uL (ref 3.4–10.8)

## 2020-06-13 LAB — BASIC METABOLIC PANEL
BUN/Creatinine Ratio: 18 (ref 10–24)
BUN: 25 mg/dL (ref 8–27)
CO2: 25 mmol/L (ref 20–29)
Calcium: 9.9 mg/dL (ref 8.6–10.2)
Chloride: 104 mmol/L (ref 96–106)
Creatinine, Ser: 1.37 mg/dL — ABNORMAL HIGH (ref 0.76–1.27)
GFR calc Af Amer: 54 mL/min/{1.73_m2} — ABNORMAL LOW (ref >59)
GFR calc non Af Amer: 46 mL/min/{1.73_m2} — ABNORMAL LOW (ref >59)
Glucose: 106 mg/dL — ABNORMAL HIGH (ref 65–99)
Potassium: 4.8 mmol/L (ref 3.5–5.2)
Sodium: 138 mmol/L (ref 134–144)

## 2020-06-13 NOTE — Patient Instructions (Signed)
Medication Instructions:  Your physician recommends that you continue on your current medications as directed. Please refer to the Current Medication list given to you today.  *If you need a refill on your cardiac medications before your next appointment, please call your pharmacy*  Lab Work: CBC, BMP  If you have labs (blood work) drawn today and your tests are completely normal, you will receive your results only by: Marland Kitchen MyChart Message (if you have MyChart) OR . A paper copy in the mail If you have any lab test that is abnormal or we need to change your treatment, we will call you to review the results.  Testing/Procedures: Your physician has recommended that you have a pacemaker inserted. A pacemaker is a small device that is placed under the skin of your chest or abdomen to help control abnormal heart rhythms. This device uses electrical pulses to prompt the heart to beat at a normal rate. Pacemakers are used to treat heart rhythms that are too slow. Wire (leads) are attached to the pacemaker that goes into the chambers of you heart. This is done in the hospital and usually requires and overnight stay. Please see the instruction sheet given to you today for more information.    Follow-Up: At [facility], you and your health needs are our priority.  As part of our continuing mission to provide you with exceptional heart care, we have created designated Provider Care Teams.  These Care Teams include your primary Cardiologist (physician) and Advanced Practice Providers (APPs -  Physician Assistants and Nurse Practitioners) who all work together to provide you with the care you need, when you need it.  We recommend signing up for the patient portal called "MyChart".  Sign up information is provided on this After Visit Summary.  MyChart is used to connect with patients for Virtual Visits (Telemedicine).  Patients are able to view lab/test results, encounter notes, upcoming appointments, etc.   Non-urgent messages can be sent to your provider as well.   To learn more about what you can do with MyChart, go to [email or web address].     Other Instructions:  Pacemaker Implantation, Adult Pacemaker implantation is a procedure to place a pacemaker inside your chest. A pacemaker is a small computer that sends electrical signals to the heart and helps your heart beat normally. A pacemaker also stores information about your heart rhythms. You may need pacemaker implantation if you:  Have a slow heartbeat (bradycardia).  Faint (syncope).  Have shortness of breath (dyspnea) due to heart problems. The pacemaker attaches to your heart through a wire, called a lead. Sometimes just one lead is needed. Other times, there will be two leads. There are two types of pacemakers:  Transvenous pacemaker. This type is placed under the skin or muscle of your chest. The lead goes through a vein in the chest area to reach the inside of the heart.  Epicardial pacemaker. This type is placed under the skin or muscle of your chest or belly. The lead goes through your chest to the outside of the heart. Tell a health care provider about:  Any allergies you have.  All medicines you are taking, including vitamins, herbs, eye drops, creams, and over-the-counter medicines.  Any problems you or family members have had with anesthetic medicines.  Any blood or bone disorders you have.  Any surgeries you have had.  Any medical conditions you have.  Whether you are pregnant or may be pregnant. What are the risks? Generally, this is a  safe procedure. However, problems may occur, including:  Infection.  Bleeding.  Failure of the pacemaker or the lead.  Collapse of a lung or bleeding into a lung.  Blood clot inside a blood vessel with a lead.  Damage to the heart.  Infection inside the heart (endocarditis).  Allergic reactions to medicines. What happens before the procedure? Staying  hydrated Follow instructions from your health care provider about hydration, which may include:  Up to 2 hours before the procedure - you may continue to drink clear liquids, such as water, clear fruit juice, black coffee, and plain tea. Eating and drinking restrictions Follow instructions from your health care provider about eating and drinking, which may include:  8 hours before the procedure - stop eating heavy meals or foods such as meat, fried foods, or fatty foods.  6 hours before the procedure - stop eating light meals or foods, such as toast or cereal.  6 hours before the procedure - stop drinking milk or drinks that contain milk.  2 hours before the procedure - stop drinking clear liquids. Medicines  Ask your health care provider about: ? Changing or stopping your regular medicines. This is especially important if you are taking diabetes medicines or blood thinners. ? Taking medicines such as aspirin and ibuprofen. These medicines can thin your blood. Do not take these medicines before your procedure if your health care provider instructs you not to.  You may be given antibiotic medicine to help prevent infection. General instructions  You will have a heart evaluation. This may include an electrocardiogram (ECG), chest X-ray, and heart imaging (echocardiogram,  or echo) tests.  You will have blood tests.  Do not use any products that contain nicotine or tobacco, such as cigarettes and e-cigarettes. If you need help quitting, ask your health care provider.  Plan to have someone take you home from the hospital or clinic.  If you will be going home right after the procedure, plan to have someone with you for 24 hours.  Ask your health care provider how your surgical site will be marked or identified. What happens during the procedure?  To reduce your risk of infection: ? Your health care team will wash or sanitize their hands. ? Your skin will be washed with soap. ? Hair  may be removed from the surgical area.  An IV tube will be inserted into one of your veins.  You will be given one or more of the following: ? A medicine to help you relax (sedative). ? A medicine to numb the area (local anesthetic). ? A medicine to make you fall asleep (general anesthetic).  If you are getting a transvenous pacemaker: ? An incision will be made in your upper chest. ? A pocket will be made for the pacemaker. It may be placed under the skin or between layers of muscle. ? The lead will be inserted into a blood vessel that returns to the heart. ? While X-rays are taken by an imaging machine (fluoroscopy), the lead will be advanced through the vein to the inside of your heart. ? The other end of the lead will be tunneled under the skin and attached to the pacemaker.  If you are getting an epicardial pacemaker: ? An incision will be made near your ribs or breastbone (sternum) for the lead. ? The lead will be attached to the outside of your heart. ? Another incision will be made in your chest or upper belly to create a pocket for the  pacemaker. ? The free end of the lead will be tunneled under the skin and attached to the pacemaker.  The transvenous or epicardial pacemaker will be tested. Imaging studies may be done to check the lead position.  The incisions will be closed with stitches (sutures), adhesive strips, or skin glue.  Bandages (dressing) will be placed over the incisions. The procedure may vary among health care providers and hospitals. What happens after the procedure?  Your blood pressure, heart rate, breathing rate, and blood oxygen level will be monitored until the medicines you were given have worn off.  You will be given antibiotics and pain medicine.  ECG and chest x-rays will be done.  You will wear a continuous type of ECG (Holter monitor) to check your heart rhythm.  Your health care provider will program the pacemaker.  Do not drive for 24  hours if you received a sedative. This information is not intended to replace advice given to you by your health care provider. Make sure you discuss any questions you have with your health care provider. Document Revised: 02/28/2018 Document Reviewed: 11/23/2015 Elsevier Patient Education  [place].

## 2020-06-13 NOTE — Telephone Encounter (Signed)
Patient can not find his implantable device instruction letter that I gave him in the office today. Therefore, I went over all the information with them over the phone and told him who to access the letter over mychart.  Patient verbalized understanding.

## 2020-06-13 NOTE — Progress Notes (Signed)
PCP: [name], MD Primary Cardiologist: Dr Einar Gip Primary EP: Dr Rayann Heman  Degan Kevin Levine is a 86 y.o. male who presents today for routine electrophysiology followup.  Since last being seen in our clinic, the patient reports doing very well.  Today, he denies symptoms of palpitations, chest pain, shortness of breath,  lower extremity edema, dizziness, presyncope, or syncope.  The patient is otherwise without complaint today.   Past Medical History:  Diagnosis Date  . Arthritis   . Chicken pox   . Colon polyps   . Coronary artery disease   . ED (erectile dysfunction)   . Essential hypertension   . Gout   . Heart disease   . Hyperlipidemia   . Kidney disease   . Seasonal allergies    Past Surgical History:  Procedure Laterality Date  . APPENDECTOMY    . CATARACT EXTRACTION Bilateral   . CORONARY ANGIOPLASTY WITH STENT PLACEMENT  2016  . Status post ballon angioplasty of the left circumflex and drug eluting stent to the distal left main into the proximal LAD   03/14/2015    ROS- all systems are reviewed and negatives except as per HPI above  Current Outpatient Medications  Medication Sig Dispense Refill  . allopurinol (ZYLOPRIM) 100 MG tablet Take 1 tablet (100 mg total) by mouth daily. 90 tablet 3  . amLODipine (NORVASC) 5 MG tablet Take 5 mg by mouth daily.    Marland Kitchen aspirin EC 81 MG tablet Take 81 mg by mouth daily.    Marland Kitchen atorvastatin (LIPITOR) 80 MG tablet Take 1 tablet (80 mg total) by mouth daily. 90 tablet 3  . diphenhydrAMINE (BENADRYL) 25 MG tablet Take 1 tablet by mouth as needed for sleep.    . diphenhydramine-acetaminophen (TYLENOL PM EXTRA STRENGTH) 25-500 MG TABS tablet Take 1 tablet by mouth as needed for sleep.    Marland Kitchen doxazosin (CARDURA) 2 MG tablet TAKE 1 TABLET DAILY 90 tablet 3  . ezetimibe (ZETIA) 10 MG tablet Take 10 mg by mouth daily after supper.   1  . ferrous sulfate 325 (65 FE) MG tablet Take 325 mg by mouth every other day.    . loratadine (CLARITIN)  10 MG tablet Take 10 mg by mouth daily as needed for allergies.     Marland Kitchen losartan (COZAAR) 100 MG tablet Take 1 tablet (100 mg total) by mouth daily. 90 tablet 1  . nitroGLYCERIN (NITROSTAT) 0.4 MG SL tablet Place 0.4 mg under the tongue every 5 (five) minutes as needed.   4  . zolpidem (AMBIEN) 5 MG tablet Take 5 mg by mouth at bedtime as needed for sleep.     No current facility-administered medications for this visit.    Physical Exam: Vitals:   06/13/20 1220  BP: 126/64  Pulse: 97  SpO2: 97%  Weight: 217 lb 12.8 oz (98.8 kg)  Height: 5\' 11"  (1.803 m)    GEN- The patient is well appearing, alert and oriented x 3 today.   Head- normocephalic, atraumatic Eyes-  Sclera clear, conjunctiva pink Ears- hearing intact Oropharynx- clear Lungs- Clear to ausculation bilaterally, normal work of breathing Heart- Regular rate and rhythm, no murmurs, rubs or gallops, PMI not laterally displaced GI- soft, NT, ND, + BS Extremities- no clubbing, cyanosis, or edema  Wt Readings from Last 3 Encounters:  06/13/20 217 lb 12.8 oz (98.8 kg)  05/30/20 215 lb (97.5 kg)  05/24/20 214 lb 9.6 oz (97.3 kg)    EKG tracing ordered today is personally reviewed  and shows sinus rhythm with PVCs  Event monitor 11/21 is personally reviewed and shows daytime mobitz II second degree AV block  Assessment and Plan:  1. Mobitz II second degree AV block In retrospect, the patient now thinks that his prior event may have been syncope rather than falling asleep, though this history is still inconsistent. The patient has symptomatic bradycardia.  No reversible causes are found.  I would therefore recommend pacemaker implantation at this time.  Risks, benefits, alternatives to pacemaker implantation were discussed in detail with the patient today. The patient understands that the risks include but are not limited to bleeding, infection, pneumothorax, perforation, tamponade, vascular damage, renal failure, MI, stroke,  death,  and lead dislodgement and wishes to proceed. We will therefore schedule the procedure at the next available time.  We also discussed remote monitoring and its role today. No driving (pt aware)  Risks, benefits and potential toxicities for medications prescribed and/or refilled reviewed with patient today.   Thompson Grayer MD, [facility] 06/13/2020 12:44 PM

## 2020-06-13 NOTE — Telephone Encounter (Signed)
Patient is calling because he has to get a pacemaker implant on 06/15/2020 at 2:30pm but he doesn't know what time to arrive. Please advise.

## 2020-06-13 NOTE — H&P (View-Only) (Signed)
PCP: [name], MD Primary Cardiologist: Dr Einar Gip Primary EP: Dr Rayann Heman  Kevin Levine is a 86 y.o. male who presents today for routine electrophysiology followup.  Since last being seen in our clinic, the patient reports doing very well.  Today, he denies symptoms of palpitations, chest pain, shortness of breath,  lower extremity edema, dizziness, presyncope, or syncope.  The patient is otherwise without complaint today.   Past Medical History:  Diagnosis Date  . Arthritis   . Chicken pox   . Colon polyps   . Coronary artery disease   . ED (erectile dysfunction)   . Essential hypertension   . Gout   . Heart disease   . Hyperlipidemia   . Kidney disease   . Seasonal allergies    Past Surgical History:  Procedure Laterality Date  . APPENDECTOMY    . CATARACT EXTRACTION Bilateral   . CORONARY ANGIOPLASTY WITH STENT PLACEMENT  2016  . Status post ballon angioplasty of the left circumflex and drug eluting stent to the distal left main into the proximal LAD   03/14/2015    ROS- all systems are reviewed and negatives except as per HPI above  Current Outpatient Medications  Medication Sig Dispense Refill  . allopurinol (ZYLOPRIM) 100 MG tablet Take 1 tablet (100 mg total) by mouth daily. 90 tablet 3  . amLODipine (NORVASC) 5 MG tablet Take 5 mg by mouth daily.    Marland Kitchen aspirin EC 81 MG tablet Take 81 mg by mouth daily.    Marland Kitchen atorvastatin (LIPITOR) 80 MG tablet Take 1 tablet (80 mg total) by mouth daily. 90 tablet 3  . diphenhydrAMINE (BENADRYL) 25 MG tablet Take 1 tablet by mouth as needed for sleep.    . diphenhydramine-acetaminophen (TYLENOL PM EXTRA STRENGTH) 25-500 MG TABS tablet Take 1 tablet by mouth as needed for sleep.    Marland Kitchen doxazosin (CARDURA) 2 MG tablet TAKE 1 TABLET DAILY 90 tablet 3  . ezetimibe (ZETIA) 10 MG tablet Take 10 mg by mouth daily after supper.   1  . ferrous sulfate 325 (65 FE) MG tablet Take 325 mg by mouth every other day.    . loratadine (CLARITIN)  10 MG tablet Take 10 mg by mouth daily as needed for allergies.     Marland Kitchen losartan (COZAAR) 100 MG tablet Take 1 tablet (100 mg total) by mouth daily. 90 tablet 1  . nitroGLYCERIN (NITROSTAT) 0.4 MG SL tablet Place 0.4 mg under the tongue every 5 (five) minutes as needed.   4  . zolpidem (AMBIEN) 5 MG tablet Take 5 mg by mouth at bedtime as needed for sleep.     No current facility-administered medications for this visit.    Physical Exam: Vitals:   06/13/20 1220  BP: 126/64  Pulse: 97  SpO2: 97%  Weight: 217 lb 12.8 oz (98.8 kg)  Height: 5\' 11"  (1.803 m)    GEN- The patient is well appearing, alert and oriented x 3 today.   Head- normocephalic, atraumatic Eyes-  Sclera clear, conjunctiva pink Ears- hearing intact Oropharynx- clear Lungs- Clear to ausculation bilaterally, normal work of breathing Heart- Regular rate and rhythm, no murmurs, rubs or gallops, PMI not laterally displaced GI- soft, NT, ND, + BS Extremities- no clubbing, cyanosis, or edema  Wt Readings from Last 3 Encounters:  06/13/20 217 lb 12.8 oz (98.8 kg)  05/30/20 215 lb (97.5 kg)  05/24/20 214 lb 9.6 oz (97.3 kg)    EKG tracing ordered today is personally reviewed  and shows sinus rhythm with PVCs  Event monitor 11/21 is personally reviewed and shows daytime mobitz II second degree AV block  Assessment and Plan:  1. Mobitz II second degree AV block In retrospect, the patient now thinks that his prior event may have been syncope rather than falling asleep, though this history is still inconsistent. The patient has symptomatic bradycardia.  No reversible causes are found.  I would therefore recommend pacemaker implantation at this time.  Risks, benefits, alternatives to pacemaker implantation were discussed in detail with the patient today. The patient understands that the risks include but are not limited to bleeding, infection, pneumothorax, perforation, tamponade, vascular damage, renal failure, MI, stroke,  death,  and lead dislodgement and wishes to proceed. We will therefore schedule the procedure at the next available time.  We also discussed remote monitoring and its role today. No driving (pt aware)  Risks, benefits and potential toxicities for medications prescribed and/or refilled reviewed with patient today.   Thompson Grayer MD, [facility] 06/13/2020 12:44 PM

## 2020-06-14 ENCOUNTER — Other Ambulatory Visit (HOSPITAL_COMMUNITY)
Admission: RE | Admit: 2020-06-14 | Discharge: 2020-06-14 | Disposition: A | Discharge status: Home / Self Care | Payer: Medicare Other | Source: Ambulatory Visit | Attending: Internal Medicine | Admitting: Internal Medicine

## 2020-06-14 DIAGNOSIS — Z20822 Contact with and (suspected) exposure to covid-19: Secondary | ICD-10-CM | POA: Diagnosis not present

## 2020-06-14 DIAGNOSIS — Z01812 Encounter for preprocedural laboratory examination: Secondary | ICD-10-CM | POA: Diagnosis not present

## 2020-06-14 LAB — SARS CORONAVIRUS 2 (TAT 6-24 HRS): SARS Coronavirus 2: NEGATIVE

## 2020-06-14 NOTE — Progress Notes (Signed)
Instructed patient on the following items: Arrival time 1200 Nothing to eat or drink after midnight No meds AM of procedure Responsible person to drive you home and stay with you for 24 hrs Wash with special soap night before and morning of procedure  

## 2020-06-15 ENCOUNTER — Ambulatory Visit (HOSPITAL_COMMUNITY): Payer: Medicare Other

## 2020-06-15 ENCOUNTER — Ambulatory Visit (HOSPITAL_COMMUNITY)
Admission: RE | Disposition: A | Discharge status: Home / Self Care | Payer: Self-pay | Source: Ambulatory Visit | Attending: Internal Medicine

## 2020-06-15 ENCOUNTER — Other Ambulatory Visit: Payer: Self-pay

## 2020-06-15 ENCOUNTER — Ambulatory Visit (HOSPITAL_COMMUNITY)
Admission: RE | Admit: 2020-06-15 | Discharge: 2020-06-15 | Disposition: A | Discharge status: Home / Self Care | Payer: Medicare Other | Source: Ambulatory Visit | Attending: Internal Medicine | Admitting: Internal Medicine

## 2020-06-15 DIAGNOSIS — Z45018 Encounter for adjustment and management of other part of cardiac pacemaker: Secondary | ICD-10-CM | POA: Diagnosis not present

## 2020-06-15 DIAGNOSIS — Z7982 Long term (current) use of aspirin: Secondary | ICD-10-CM | POA: Insufficient documentation

## 2020-06-15 DIAGNOSIS — R001 Bradycardia, unspecified: Secondary | ICD-10-CM | POA: Diagnosis not present

## 2020-06-15 DIAGNOSIS — Z79899 Other long term (current) drug therapy: Secondary | ICD-10-CM | POA: Diagnosis not present

## 2020-06-15 DIAGNOSIS — I441 Atrioventricular block, second degree: Secondary | ICD-10-CM | POA: Insufficient documentation

## 2020-06-15 DIAGNOSIS — Z950 Presence of cardiac pacemaker: Secondary | ICD-10-CM

## 2020-06-15 HISTORY — PX: PACEMAKER IMPLANT: EP1218

## 2020-06-15 IMAGING — CR DG CHEST 2V
2 series · 2 of 2 positions shown · non-contrast
Comparison: None.
COMPARISON: None.

Addendum:
CLINICAL DATA: Pacemaker placement.

EXAM:
CHEST - 2 VIEW

[w chest pa]
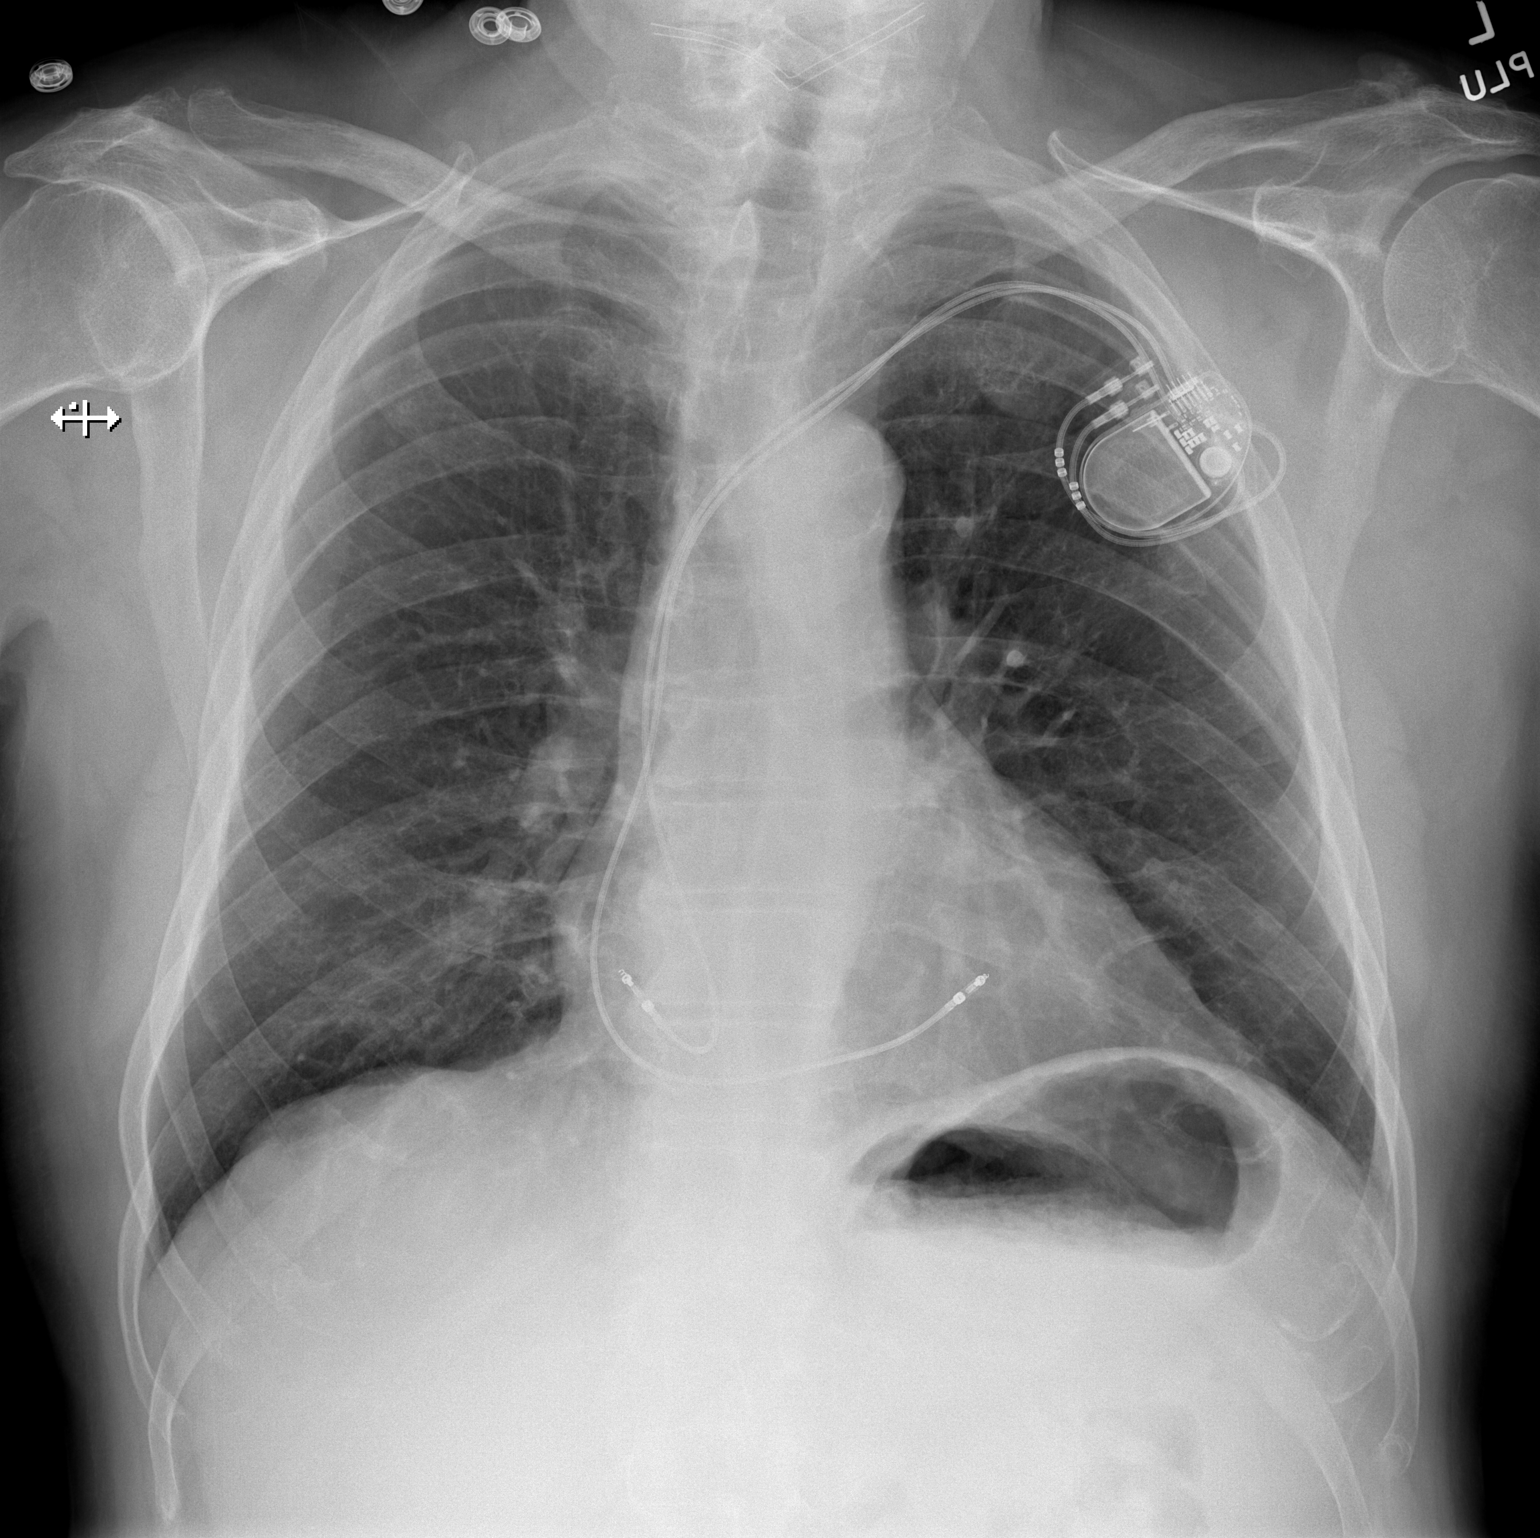

[w chest lat]
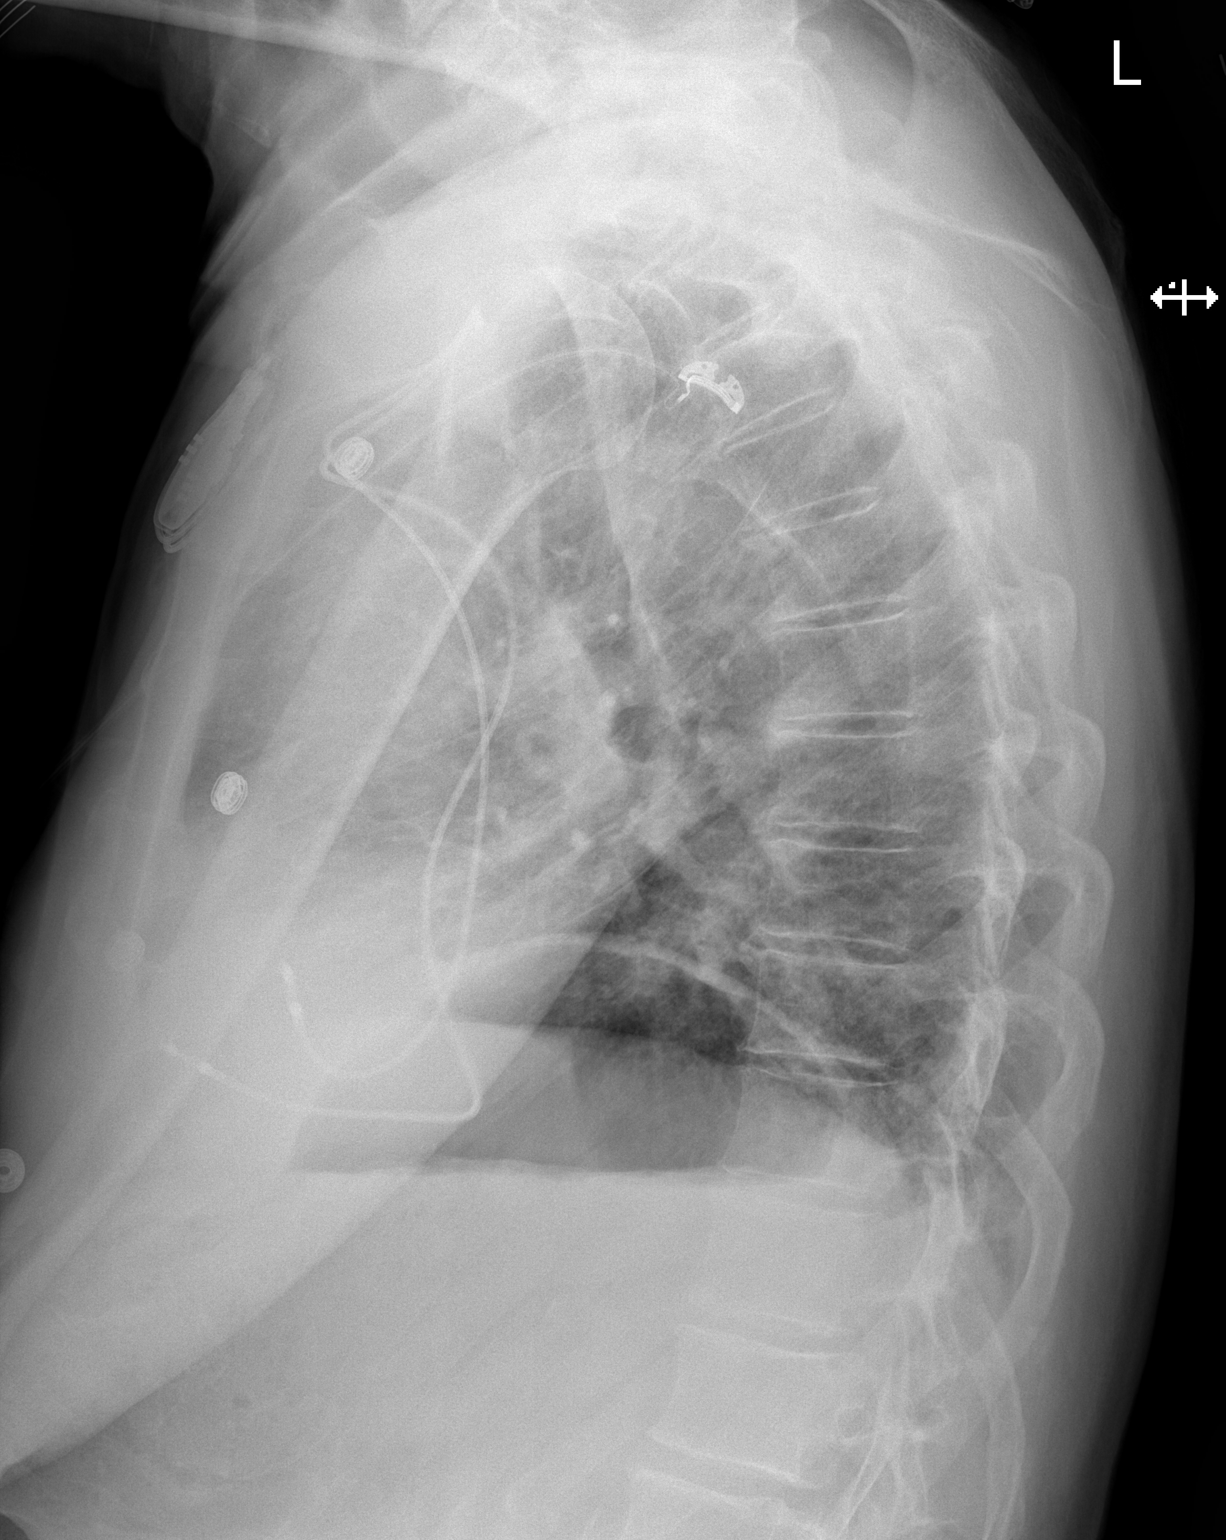

[2 of 2 positions shown; findings below may reference images not displayed]

FINDINGS: Heart size is normal. Left subclavian dual chamber pacemaker is in
place. Leads terminate in the right atrium and right ventricle.
Pneumothorax is present. Atherosclerotic changes are present at the
aortic arch. Lungs are clear. No edema or effusion is present. Axial
skeleton is unremarkable.
IMPRESSION: Interval placement of left subclavian dual chamber pacemaker without
radiographic evidence for complication.

ADDENDUM:
Voice recognition error: The fourth sentence of the Findings section
should read "No pneumothorax is present. "

*** End of Addendum ***
FINDINGS: Heart size is normal. Left subclavian dual chamber pacemaker is in
place. Leads terminate in the right atrium and right ventricle.
Pneumothorax is present. Atherosclerotic changes are present at the
aortic arch. Lungs are clear. No edema or effusion is present. Axial
skeleton is unremarkable.
IMPRESSION: Interval placement of left subclavian dual chamber pacemaker without
radiographic evidence for complication.

## 2020-06-15 SURGERY — PACEMAKER IMPLANT

## 2020-06-15 MED ORDER — SODIUM CHLORIDE 0.9 % IV SOLN: INTRAVENOUS | Status: DC

## 2020-06-15 MED ORDER — LIDOCAINE HCL (PF) 1 % IJ SOLN
INTRAMUSCULAR | Status: DC | PRN
  Administered 2020-06-15: 60 mL

## 2020-06-15 MED ORDER — SODIUM CHLORIDE 0.9 % IV SOLN: 250.0000 mL | INTRAVENOUS | Status: DC | PRN

## 2020-06-15 MED ORDER — CEFAZOLIN SODIUM-DEXTROSE 2-4 GM/100ML-% IV SOLN
INTRAVENOUS | Status: AC
  Filled 2020-06-15: qty 100

## 2020-06-15 MED ORDER — LIDOCAINE HCL 1 % IJ SOLN
INTRAMUSCULAR | Status: AC
  Filled 2020-06-15: qty 60

## 2020-06-15 MED ORDER — MIDAZOLAM HCL 5 MG/5ML IJ SOLN
INTRAMUSCULAR | Status: AC
  Filled 2020-06-15: qty 5

## 2020-06-15 MED ORDER — HEPARIN (PORCINE) IN NACL 1000-0.9 UT/500ML-% IV SOLN
INTRAVENOUS | Status: AC
  Filled 2020-06-15: qty 500

## 2020-06-15 MED ORDER — SODIUM CHLORIDE 0.9% FLUSH: 3.0000 mL | Freq: Two times a day (BID) | INTRAVENOUS | Status: DC

## 2020-06-15 MED ORDER — SODIUM CHLORIDE 0.9 % IV SOLN
80.0000 mg | INTRAVENOUS | Status: AC
  Administered 2020-06-15: 80 mg

## 2020-06-15 MED ORDER — SODIUM CHLORIDE 0.9% FLUSH: 3.0000 mL | INTRAVENOUS | Status: DC | PRN

## 2020-06-15 MED ORDER — HEPARIN (PORCINE) IN NACL 1000-0.9 UT/500ML-% IV SOLN
INTRAVENOUS | Status: DC | PRN
  Administered 2020-06-15: 500 mL

## 2020-06-15 MED ORDER — CEFAZOLIN SODIUM-DEXTROSE 2-4 GM/100ML-% IV SOLN
2.0000 g | INTRAVENOUS | Status: AC
  Administered 2020-06-15: 2 g via INTRAVENOUS

## 2020-06-15 MED ORDER — ONDANSETRON HCL 4 MG/2ML IJ SOLN: 4.0000 mg | Freq: Four times a day (QID) | INTRAMUSCULAR | Status: DC | PRN

## 2020-06-15 MED ORDER — IOHEXOL 350 MG/ML SOLN
INTRAVENOUS | Status: DC | PRN
  Administered 2020-06-15: 15 mL

## 2020-06-15 MED ORDER — ACETAMINOPHEN 325 MG PO TABS
325.0000 mg | ORAL_TABLET | ORAL | Status: DC | PRN
  Filled 2020-06-15: qty 2

## 2020-06-15 MED ORDER — SODIUM CHLORIDE 0.9 % IV SOLN
INTRAVENOUS | Status: AC
  Filled 2020-06-15: qty 2

## 2020-06-15 MED ORDER — MIDAZOLAM HCL 5 MG/5ML IJ SOLN
INTRAMUSCULAR | Status: DC | PRN
  Administered 2020-06-15 (×2): 1 mg via INTRAVENOUS

## 2020-06-15 SURGICAL SUPPLY — 7 items
CABLE SURGICAL S-101-97-12 (CABLE) ×2 IMPLANT
LEAD TENDRIL MRI 52CM LPA1200M (Lead) ×2 IMPLANT
LEAD TENDRIL MRI 58CM LPA1200M (Lead) ×2 IMPLANT
PACEMAKER ASSURITY DR-RF (Pacemaker) ×2 IMPLANT
PAD PRO RADIOLUCENT 2001M-C (PAD) ×2 IMPLANT
SHEATH 8FR PRELUDE SNAP 13 (SHEATH) ×2 IMPLANT
TRAY PACEMAKER INSERTION (PACKS) ×2 IMPLANT

## 2020-06-15 NOTE — Interval H&P Note (Signed)
History and Physical Interval Note:  06/15/2020 12:44 PM  Kevin Levine  has presented today for surgery, with the diagnosis of Bradycardia.  The various methods of treatment have been discussed with the patient and family. After consideration of risks, benefits and other options for treatment, the patient has consented to  Procedure(s): PACEMAKER IMPLANT (N/A) as a surgical intervention.  The patient's history has been reviewed, patient examined, no change in status, stable for surgery.  I have reviewed the patient's chart and labs.  Questions were answered to the patient's satisfaction.    Risks, benefits, alternatives to pacemaker implantation were again discussed in detail with the patient today. The patient understands that the risks include but are not limited to bleeding, infection, pneumothorax, perforation, tamponade, vascular damage, renal failure, MI, stroke, death,  and lead dislodgement and wishes to proceed.      Thompson Grayer

## 2020-06-15 NOTE — Progress Notes (Signed)
Patient was given instructions. He verbalized understanding.

## 2020-06-15 NOTE — Discharge Instructions (Signed)
    Supplemental Discharge Instructions for  Pacemaker/Defibrillator Patients  Tomorrow, 06/15/2020, send in a device transmission  Activity No heavy lifting or vigorous activity with your left/right arm for 6 to 8 weeks.  Do not raise your left/right arm above your head for one week.  Gradually raise your affected arm as drawn below.             06/19/2020               06/20/2020              06/21/2020             06/22/2020 __  NO DRIVING for 1 week  ; you may begin driving on  99/83/3825 .  WOUND CARE - Keep the wound area clean and dry.  Do not get this area wet , no showers until cleared to at your wound check visit - Tomorrow, 06/16/2020, remove the arm sling - Tomorrow, 06/16/2020 remove the outer plastic bandage.  Underneath the plastic bandage there are steri strips (paper tapes), DO NOT remove these. - The tape/steri-strips on your wound will fall off; do not pull them off.  No bandage is needed on the site.  DO  NOT apply any creams, oils, or ointments to the wound area. - If you notice any drainage or discharge from the wound, any swelling or bruising at the site, or you develop a fever > 101? F after you are discharged home, call the office at once.  Special Instructions - You are still able to use cellular telephones; use the ear opposite the side where you have your pacemaker/defibrillator.  Avoid carrying your cellular phone near your device. - When traveling through airports, show security personnel your identification card to avoid being screened in the metal detectors.  Ask the security personnel to use the hand wand. - Avoid arc welding equipment, MRI testing (magnetic resonance imaging), TENS units (transcutaneous nerve stimulators).  Call the office for questions about other devices. - Avoid electrical appliances that are in poor condition or are not properly grounded. - Microwave ovens are safe to be near or to operate.

## 2020-06-16 ENCOUNTER — Ambulatory Visit
Admission: RE | Admit: 2020-06-16 | Discharge: 2020-06-16 | Disposition: A | Discharge status: Home / Self Care | Payer: Medicare Other | Source: Ambulatory Visit | Attending: Internal Medicine | Admitting: Internal Medicine

## 2020-06-16 ENCOUNTER — Encounter (HOSPITAL_COMMUNITY): Payer: Self-pay | Admitting: Internal Medicine

## 2020-06-16 ENCOUNTER — Ambulatory Visit (INDEPENDENT_AMBULATORY_CARE_PROVIDER_SITE_OTHER): Payer: Medicare Other | Admitting: Internal Medicine

## 2020-06-16 ENCOUNTER — Other Ambulatory Visit (HOSPITAL_COMMUNITY)
Admission: RE | Admit: 2020-06-16 | Discharge: 2020-06-16 | Disposition: A | Discharge status: Home / Self Care | Payer: Medicare Other | Source: Ambulatory Visit | Attending: Internal Medicine | Admitting: Internal Medicine

## 2020-06-16 ENCOUNTER — Telehealth: Payer: Self-pay

## 2020-06-16 DIAGNOSIS — Z950 Presence of cardiac pacemaker: Secondary | ICD-10-CM | POA: Diagnosis not present

## 2020-06-16 DIAGNOSIS — T82110A Breakdown (mechanical) of cardiac electrode, initial encounter: Secondary | ICD-10-CM

## 2020-06-16 DIAGNOSIS — I700 Atherosclerosis of aorta: Secondary | ICD-10-CM | POA: Diagnosis not present

## 2020-06-16 DIAGNOSIS — Z20822 Contact with and (suspected) exposure to covid-19: Secondary | ICD-10-CM | POA: Insufficient documentation

## 2020-06-16 DIAGNOSIS — I25119 Atherosclerotic heart disease of native coronary artery with unspecified angina pectoris: Secondary | ICD-10-CM

## 2020-06-16 DIAGNOSIS — M25512 Pain in left shoulder: Secondary | ICD-10-CM | POA: Diagnosis not present

## 2020-06-16 DIAGNOSIS — Z01812 Encounter for preprocedural laboratory examination: Secondary | ICD-10-CM | POA: Insufficient documentation

## 2020-06-16 LAB — CUP PACEART INCLINIC DEVICE CHECK
Battery Remaining Longevity: 135 mo
Battery Voltage: 2.99 V
Brady Statistic RA Percent Paced: 14 %
Brady Statistic RV Percent Paced: 9.8 %
Date Time Interrogation Session: 20211223120934
Implantable Lead Implant Date: 20211222
Implantable Lead Implant Date: 20211222
Implantable Lead Location: 753859
Implantable Lead Location: 753860
Implantable Pulse Generator Implant Date: 20211222
Lead Channel Impedance Value: 600 Ohm
Lead Channel Pacing Threshold Amplitude: 0.5 V
Lead Channel Pacing Threshold Amplitude: 0.5 V
Lead Channel Pacing Threshold Pulse Width: 0.4 ms
Lead Channel Pacing Threshold Pulse Width: 0.4 ms
Lead Channel Sensing Intrinsic Amplitude: 12 mV
Lead Channel Setting Pacing Amplitude: 3.5 V
Lead Channel Setting Pacing Pulse Width: 0.4 ms
Lead Channel Setting Sensing Sensitivity: 2 mV
Pulse Gen Model: 2272
Pulse Gen Serial Number: 3884182

## 2020-06-16 LAB — SARS CORONAVIRUS 2 (TAT 6-24 HRS): SARS Coronavirus 2: NEGATIVE

## 2020-06-16 IMAGING — CR DG CHEST 2V
2 series · 2 of 2 positions shown · non-contrast
Comparison: [DATE]

CLINICAL DATA: Pacemaker lead failure, LEFT arm and shoulder pain

EXAM:
CHEST - 2 VIEW

[w chest pa]
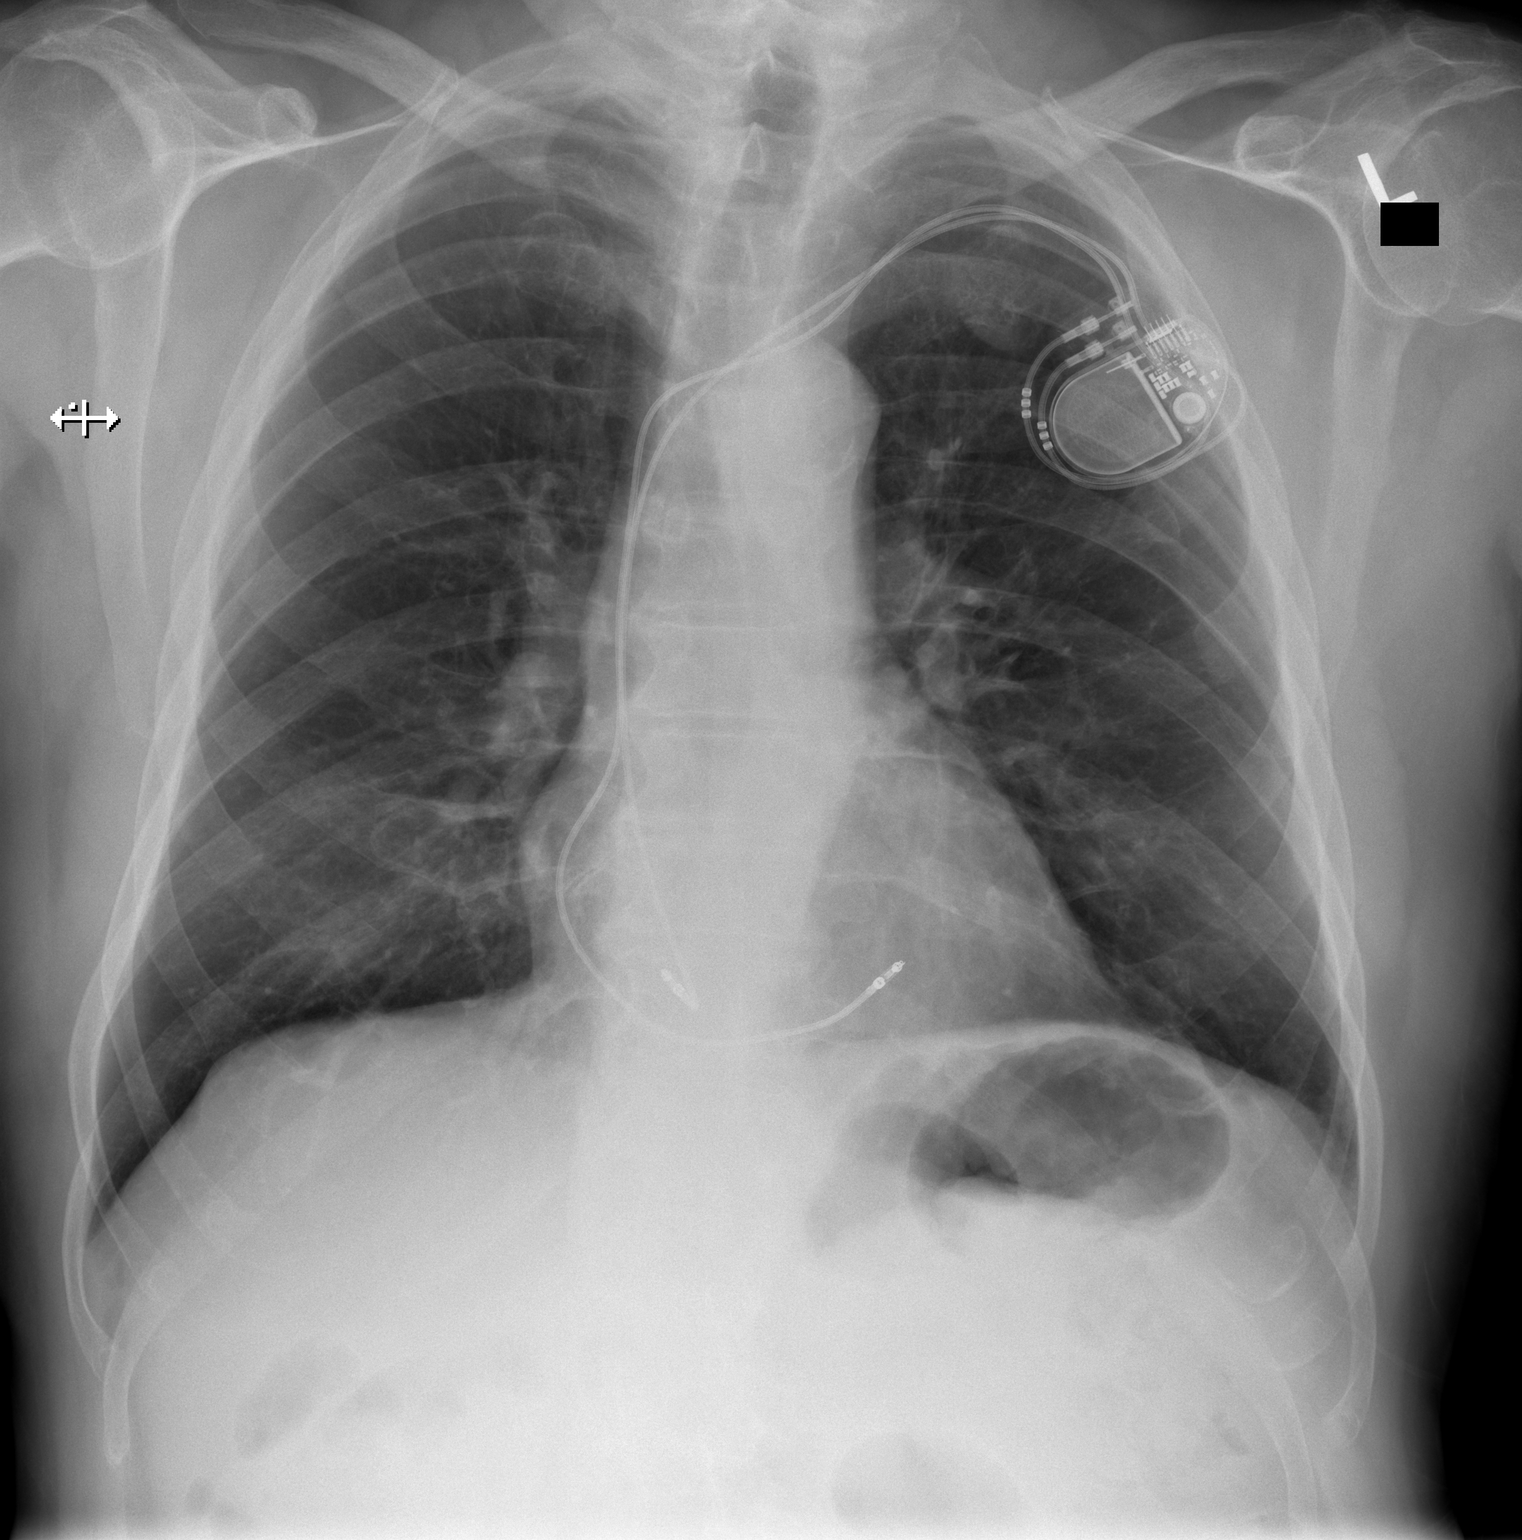

[w chest lat]
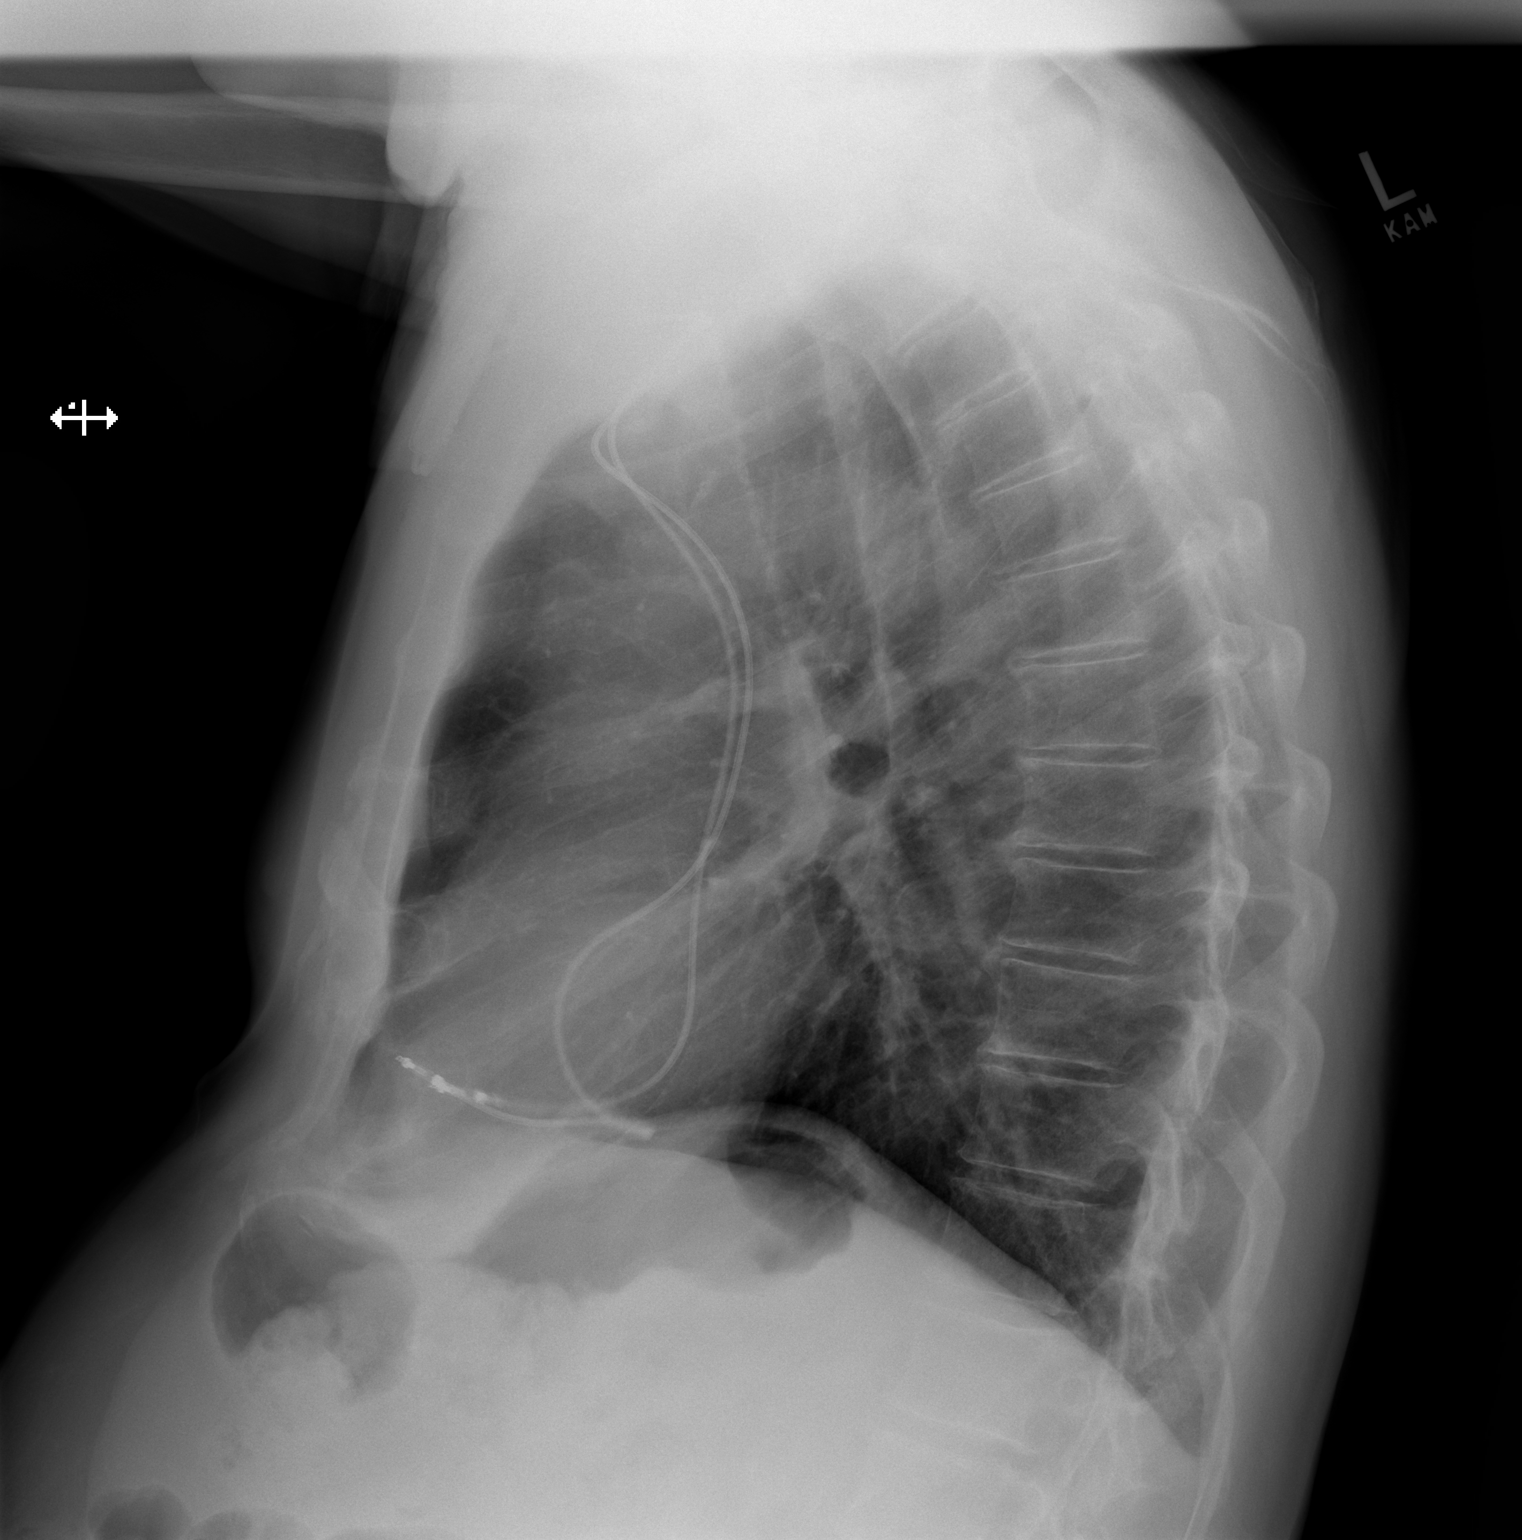

[2 of 2 positions shown; findings below may reference images not displayed]

FINDINGS: LEFT subclavian transvenous pacemaker with leads projecting at RIGHT
atrium and RIGHT ventricle, unchanged.

Leads appear intact.

Normal heart size, mediastinal contours, and pulmonary vascularity.

Atherosclerotic calcification aorta.

Lungs clear.

No infiltrate, pleural effusion, or pneumothorax.

Osseous structures unremarkable.
IMPRESSION: No acute abnormalities.

## 2020-06-16 MED FILL — Lidocaine HCl Local Inj 1%: INTRAMUSCULAR | Qty: 60 | Status: AC

## 2020-06-16 NOTE — Patient Instructions (Signed)
Medication Instructions:  Your physician recommends that you continue on your current medications as directed. Please refer to the Current Medication list given to you today.  Labwork: None ordered.  Testing/Procedures: You will get a chest xray next at: [facility] Address: [address], [place], [place] [address]  Follow-Up:  SEE INSTRUCTION LETTER  Any Other Special Instructions Will Be Listed Below (If Applicable).  If you need a refill on your cardiac medications before your next appointment, please call your pharmacy.

## 2020-06-16 NOTE — Progress Notes (Signed)
PCP: [name], MD Primary Cardiologist: Dr Einar Gip Primary EP:  Dr Rayann Heman  Kevin Levine is a 86 y.o. male who presents today for electrophysiology followup.   Remote this am revealed atrial lead dislodgement. The patient is doing very well.  Today, he denies symptoms of palpitations, chest pain, shortness of breath,  lower extremity edema, dizziness, presyncope, or syncope.  The patient is otherwise without complaint today.   Past Medical History:  Diagnosis Date  . Arthritis   . Chicken pox   . Colon polyps   . Coronary artery disease   . ED (erectile dysfunction)   . Essential hypertension   . Gout   . Heart disease   . Hyperlipidemia   . Kidney disease   . Seasonal allergies    Past Surgical History:  Procedure Laterality Date  . APPENDECTOMY    . CATARACT EXTRACTION Bilateral   . CORONARY ANGIOPLASTY WITH STENT PLACEMENT  2016  . PACEMAKER IMPLANT N/A 06/15/2020   Procedure: PACEMAKER IMPLANT;  Surgeon: Thompson Grayer, MD;  Location: [facility];  Service: Cardiovascular;  Laterality: N/A;  . Status post ballon angioplasty of the left circumflex and drug eluting stent to the distal left main into the proximal LAD   03/14/2015    ROS- all systems are reviewed and negative except as per HPI above  Current Outpatient Medications  Medication Sig Dispense Refill  . allopurinol (ZYLOPRIM) 100 MG tablet Take 1 tablet (100 mg total) by mouth daily. 90 tablet 3  . amLODipine (NORVASC) 5 MG tablet Take 5 mg by mouth daily.    Marland Kitchen aspirin EC 81 MG tablet Take 81 mg by mouth daily.    Marland Kitchen atorvastatin (LIPITOR) 80 MG tablet Take 1 tablet (80 mg total) by mouth daily. 90 tablet 3  . diphenhydrAMINE (BENADRYL) 25 MG tablet Take 1 tablet by mouth as needed for sleep.    . diphenhydramine-acetaminophen (TYLENOL PM EXTRA STRENGTH) 25-500 MG TABS tablet Take 1 tablet by mouth as needed for sleep.    Marland Kitchen doxazosin (CARDURA) 2 MG tablet TAKE 1 TABLET DAILY 90 tablet 3  .  ezetimibe (ZETIA) 10 MG tablet Take 10 mg by mouth daily after supper.   1  . ferrous sulfate 325 (65 FE) MG tablet Take 325 mg by mouth every other day.    . loratadine (CLARITIN) 10 MG tablet Take 10 mg by mouth daily as needed for allergies.     Marland Kitchen losartan (COZAAR) 100 MG tablet Take 1 tablet (100 mg total) by mouth daily. 90 tablet 1  . nitroGLYCERIN (NITROSTAT) 0.4 MG SL tablet Place 0.4 mg under the tongue every 5 (five) minutes as needed.   4  . zolpidem (AMBIEN) 5 MG tablet Take 5 mg by mouth at bedtime as needed for sleep.     No current facility-administered medications for this visit.    Physical Exam: There were no vitals filed for this visit.  GEN- The patient is well appearing, alert and oriented x 3 today.   Head- normocephalic, atraumatic Eyes-  Sclera clear, conjunctiva pink Ears- hearing intact Oropharynx- clear Lungs-   normal work of breathing Chest- pacemaker pocket is without hemtaoma Heart- Regular rate and rhythm  GI- soft  Extremities- no clubbing, cyanosis, or edema  Pacemaker interrogation- reviewed in detail today,  See PACEART report   Assessment and Plan:  1. Symptomatic mobitz II second degree AV block Atrial lead is dislodged This is confirmed on CXR. See Claudia Desanctis Art  report No changes today he is not device dependant today We will schedule for lead revision with Dr Ladona Ridgel on Monday.   Risks, benefits and potential toxicities for medications prescribed and/or refilled reviewed with patient today.   Hillis Range MD, [facility] 06/16/2020 12:12 PM

## 2020-06-16 NOTE — Telephone Encounter (Signed)
Merlin alert- Activation transmission  ? new atrial lead, pacing on the QRS complex, sent to triage.  Implanted on 12/22.  Reviewed with Dr. Rayann Heman and industry, recommendation is to bring pt in for testing, may also need a CXR, if evidence of lead dislodgement continues need to program device VVIR 60 until further management can be arranged.

## 2020-06-20 ENCOUNTER — Other Ambulatory Visit: Payer: Self-pay

## 2020-06-20 ENCOUNTER — Ambulatory Visit (HOSPITAL_COMMUNITY)
Admission: RE | Admit: 2020-06-20 | Discharge: 2020-06-20 | Disposition: A | Discharge status: Home / Self Care | Payer: Medicare Other | Attending: Internal Medicine | Admitting: Internal Medicine

## 2020-06-20 ENCOUNTER — Ambulatory Visit (HOSPITAL_COMMUNITY): Payer: Medicare Other

## 2020-06-20 ENCOUNTER — Encounter (HOSPITAL_COMMUNITY)
Admission: RE | Disposition: A | Discharge status: Home / Self Care | Payer: Self-pay | Source: Home / Self Care | Attending: Internal Medicine

## 2020-06-20 DIAGNOSIS — T82120A Displacement of cardiac electrode, initial encounter: Secondary | ICD-10-CM | POA: Diagnosis not present

## 2020-06-20 DIAGNOSIS — Z950 Presence of cardiac pacemaker: Secondary | ICD-10-CM

## 2020-06-20 DIAGNOSIS — Y839 Surgical procedure, unspecified as the cause of abnormal reaction of the patient, or of later complication, without mention of misadventure at the time of the procedure: Secondary | ICD-10-CM | POA: Diagnosis not present

## 2020-06-20 DIAGNOSIS — I441 Atrioventricular block, second degree: Secondary | ICD-10-CM | POA: Insufficient documentation

## 2020-06-20 DIAGNOSIS — Z79899 Other long term (current) drug therapy: Secondary | ICD-10-CM | POA: Insufficient documentation

## 2020-06-20 DIAGNOSIS — Z7982 Long term (current) use of aspirin: Secondary | ICD-10-CM | POA: Insufficient documentation

## 2020-06-20 HISTORY — PX: LEAD REVISION/REPAIR: EP1213

## 2020-06-20 IMAGING — CR DG CHEST 2V
2 series · 2 of 2 positions shown · non-contrast
Comparison: [DATE]

CLINICAL DATA: Pacemaker placement

EXAM:
CHEST - 2 VIEW

[w chest lat]
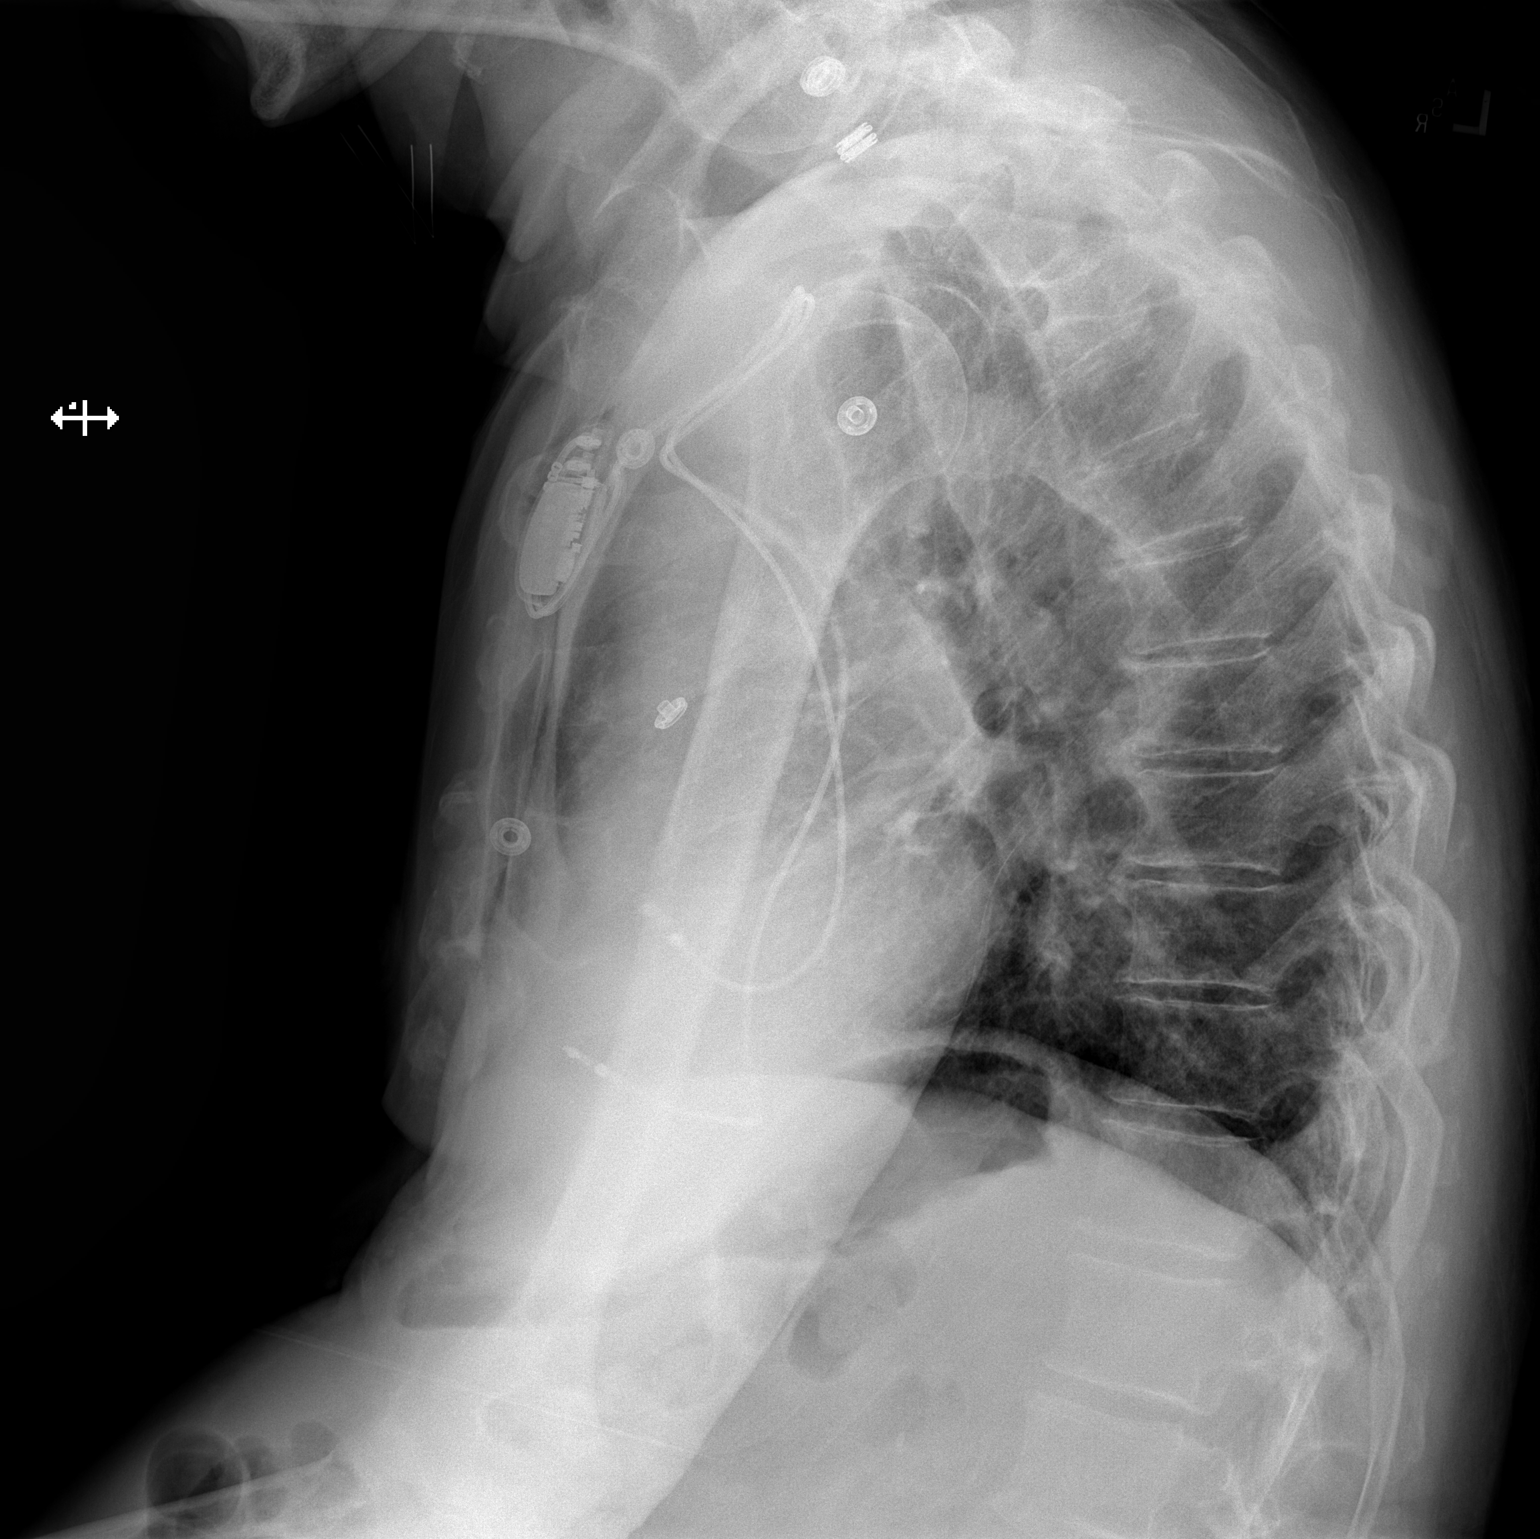

[w chest pa]
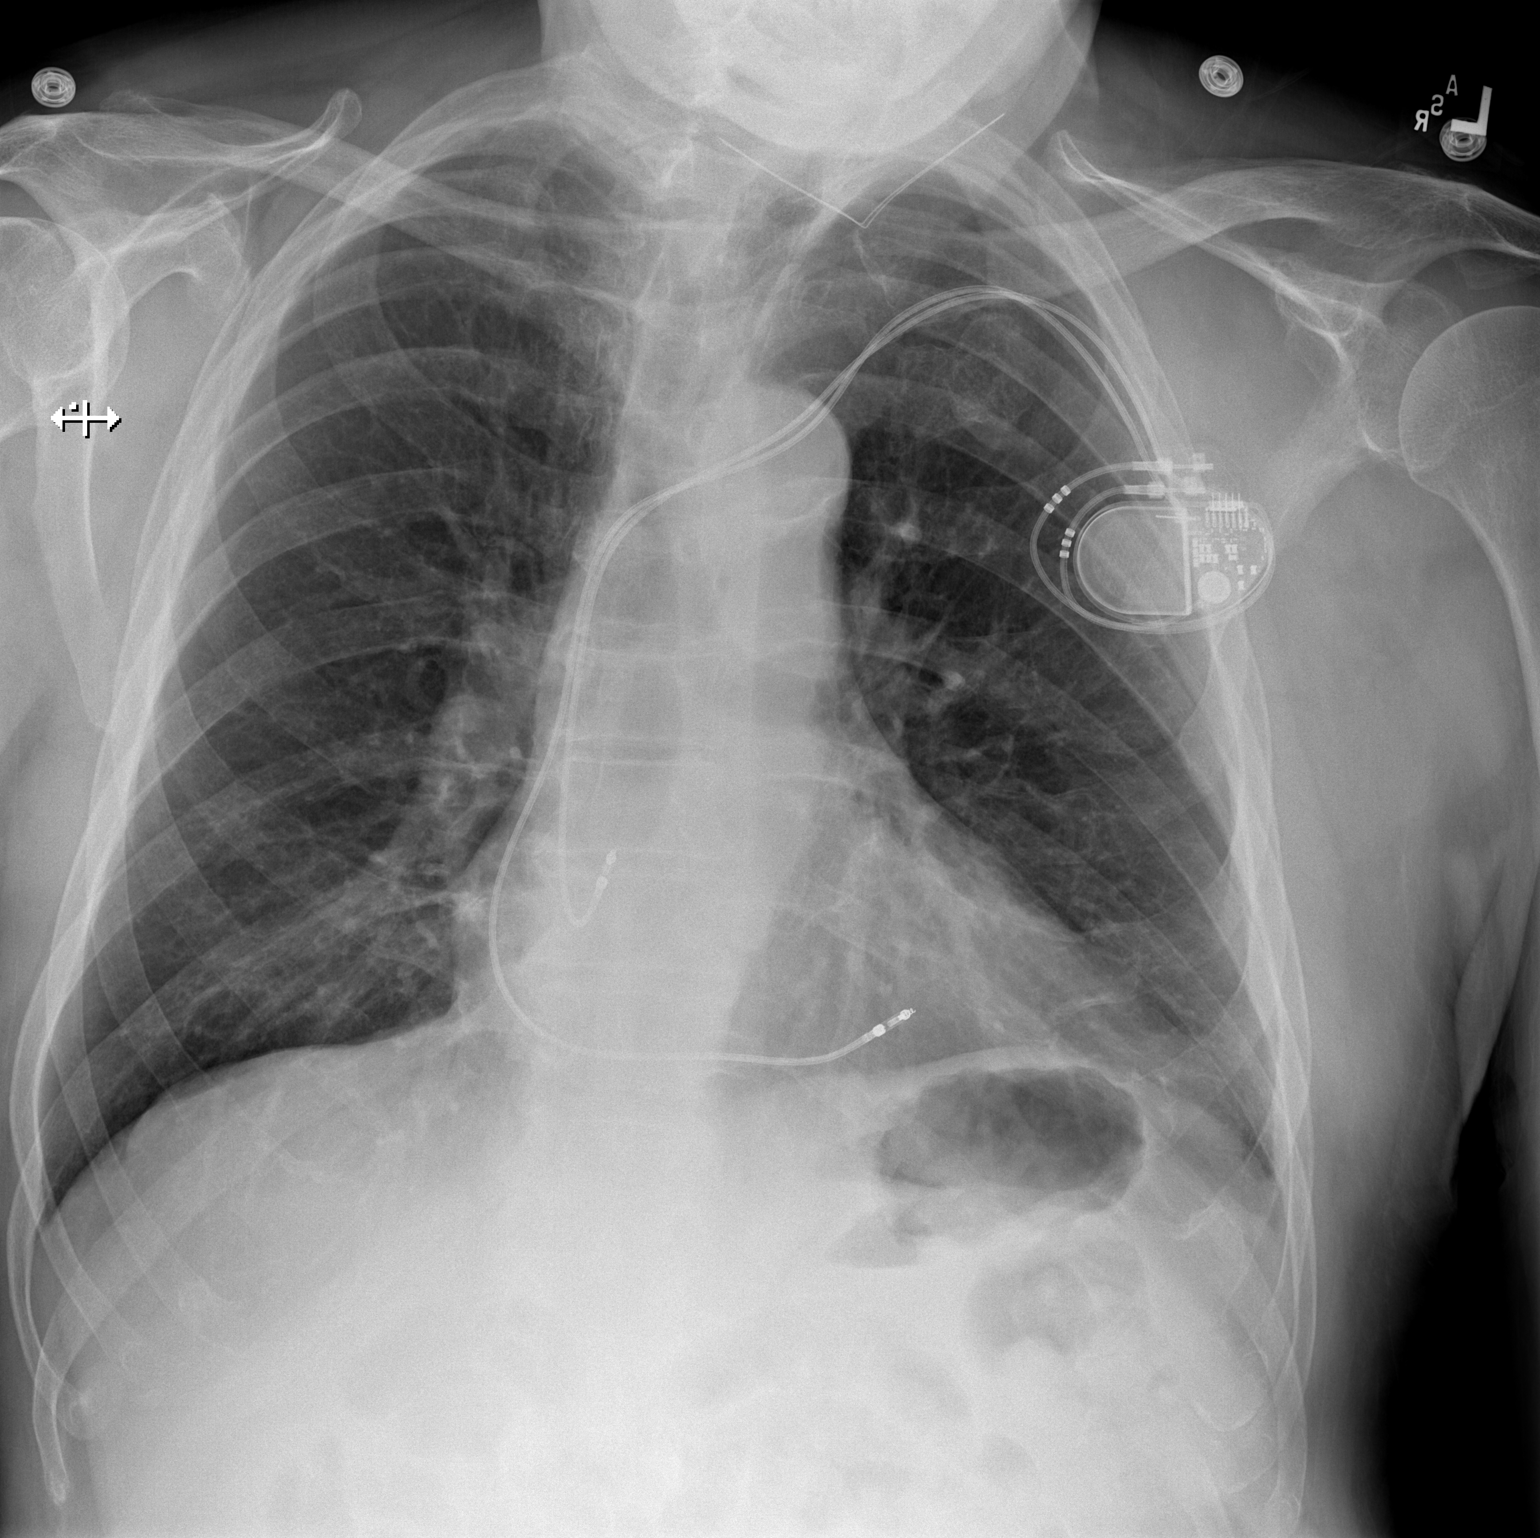

[2 of 2 positions shown; findings below may reference images not displayed]

FINDINGS: Heart size is normal. Atrial and ventricular leads appear in a
slightly different position. Particularly, the atrial lead is
higher. No pneumothorax. The lungs are clear. The vascularity is
normal.
IMPRESSION: Atrial and ventricular leads appear in a slightly different
position. Particularly, the atrial lead is higher. No pneumothorax.

## 2020-06-20 SURGERY — LEAD REVISION/REPAIR

## 2020-06-20 MED ORDER — FENTANYL CITRATE (PF) 100 MCG/2ML IJ SOLN
INTRAMUSCULAR | Status: AC
  Filled 2020-06-20: qty 2

## 2020-06-20 MED ORDER — CEFAZOLIN SODIUM-DEXTROSE 1-4 GM/50ML-% IV SOLN: 1.0000 g | Freq: Once | INTRAVENOUS | Status: DC

## 2020-06-20 MED ORDER — ACETAMINOPHEN 325 MG PO TABS: 325.0000 mg | ORAL_TABLET | ORAL | Status: DC | PRN

## 2020-06-20 MED ORDER — HEPARIN (PORCINE) IN NACL 1000-0.9 UT/500ML-% IV SOLN
INTRAVENOUS | Status: AC
  Filled 2020-06-20: qty 500

## 2020-06-20 MED ORDER — LIDOCAINE HCL (PF) 1 % IJ SOLN
INTRAMUSCULAR | Status: AC
  Filled 2020-06-20: qty 60

## 2020-06-20 MED ORDER — POVIDONE-IODINE 10 % EX SWAB: 2.0000 "application " | Freq: Once | CUTANEOUS | Status: DC

## 2020-06-20 MED ORDER — MIDAZOLAM HCL 5 MG/5ML IJ SOLN
INTRAMUSCULAR | Status: DC | PRN
  Administered 2020-06-20: 2 mg via INTRAVENOUS
  Administered 2020-06-20: 1 mg via INTRAVENOUS

## 2020-06-20 MED ORDER — ONDANSETRON HCL 4 MG/2ML IJ SOLN: 4.0000 mg | Freq: Four times a day (QID) | INTRAMUSCULAR | Status: DC | PRN

## 2020-06-20 MED ORDER — CEFAZOLIN SODIUM-DEXTROSE 2-4 GM/100ML-% IV SOLN
INTRAVENOUS | Status: AC
  Filled 2020-06-20: qty 100

## 2020-06-20 MED ORDER — SODIUM CHLORIDE 0.9 % IV SOLN
INTRAVENOUS | Status: DC | PRN
  Administered 2020-06-20: 500 mL

## 2020-06-20 MED ORDER — CEFAZOLIN SODIUM-DEXTROSE 2-4 GM/100ML-% IV SOLN
2.0000 g | INTRAVENOUS | Status: AC
  Administered 2020-06-20: 2 g via INTRAVENOUS

## 2020-06-20 MED ORDER — SODIUM CHLORIDE 0.9 % IV SOLN
INTRAVENOUS | Status: AC
  Filled 2020-06-20: qty 2

## 2020-06-20 MED ORDER — FENTANYL CITRATE (PF) 100 MCG/2ML IJ SOLN
INTRAMUSCULAR | Status: DC | PRN
  Administered 2020-06-20: 12.5 ug via INTRAVENOUS
  Administered 2020-06-20: 25 ug via INTRAVENOUS

## 2020-06-20 MED ORDER — LIDOCAINE HCL (PF) 1 % IJ SOLN
INTRAMUSCULAR | Status: DC | PRN
  Administered 2020-06-20: 60 mL

## 2020-06-20 MED ORDER — HEPARIN (PORCINE) IN NACL 1000-0.9 UT/500ML-% IV SOLN
INTRAVENOUS | Status: DC | PRN
  Administered 2020-06-20: 500 mL

## 2020-06-20 MED ORDER — SODIUM CHLORIDE 0.9 % IV SOLN: INTRAVENOUS | Status: DC

## 2020-06-20 MED ORDER — SODIUM CHLORIDE 0.9 % IV SOLN
80.0000 mg | INTRAVENOUS | Status: AC
  Administered 2020-06-20: 80 mg
  Filled 2020-06-20: qty 2

## 2020-06-20 MED ORDER — MIDAZOLAM HCL 5 MG/5ML IJ SOLN
INTRAMUSCULAR | Status: AC
  Filled 2020-06-20: qty 5

## 2020-06-20 SURGICAL SUPPLY — 5 items
CABLE SURGICAL S-101-97-12 (CABLE) ×2 IMPLANT
KIT MICROPUNCTURE NIT STIFF (SHEATH) ×2 IMPLANT
LEAD TENDRIL MRI 52CM LPA1200M (Lead) ×2 IMPLANT
PAD PRO RADIOLUCENT 2001M-C (PAD) ×2 IMPLANT
TRAY PACEMAKER INSERTION (PACKS) ×2 IMPLANT

## 2020-06-20 NOTE — Discharge Instructions (Signed)
After Your Pacemaker    ACTIVITY . Do not lift your arm above shoulder height for 1 week after your procedure. After 7 days, you may progress as below.  . You should remove your sling 24 hours after your procedure, unless otherwise instructed by your provider.     Monday June 27, 2020  Tuesday June 28, 2020 Wednesday June 29, 2020 Thursday June 30, 2020   . Do not lift, push, pull, or carry anything over 10 pounds with the affected arm until 6 weeks (Monday August 01, 2020 ) after your procedure.   . Do NOT DRIVE until you have been seen for your wound check, or as long as instructed by your healthcare provider.   . Ask your healthcare provider when you can go back to work   INCISION/Dressing . If you are on a blood thinner such as Coumadin, Xarelto, Eliquis, Plavix, or Pradaxa please confirm with your provider when this should be resumed.   . If large square, outer bandage is left in place, this can be removed after 24 hours from your procedure. Do not remove steri-strips or glue as below.   . Monitor your Pacemaker site for redness, swelling, and drainage. Call the device clinic at [phone] if you experience these symptoms or fever/chills.  . If your incision is sealed with Steri-strips or staples, you may shower 10 days after your procedure or when told by your provider. Do not remove the steri-strips or let the shower hit directly on your site. You may wash around your site with soap and water.    Marland Kitchen Avoid lotions, ointments, or perfumes over your incision until it is well-healed.  . You may use a hot tub or a pool AFTER your wound check appointment if the incision is completely closed.  Marland Kitchen PAcemaker Alerts:  Some alerts are vibratory and others beep. These are NOT emergencies. Please call our office to let us know. If this occurs at night or on weekends, it can wait until the next business day. Send a remote transmission.  . If your device is capable of reading fluid  status (for heart failure), you will be offered monthly monitoring to review this with you.   DEVICE MANAGEMENT . Remote monitoring is used to monitor your pacemaker from home. This monitoring is scheduled every 91 days by our office. It allows [place] to keep an eye on the functioning of your device to ensure it is working properly. You will routinely see your Electrophysiologist annually (more often if necessary).   . You should receive your ID card for your new device in 4-8 weeks. Keep this card with you at all times once received. Consider wearing a medical alert bracelet or necklace.  . Your Pacemaker may be MRI compatible. This will be discussed at your next office visit/wound check.  You should avoid contact with strong electric or magnetic fields.    Do not use amateur (ham) radio equipment or electric (arc) welding torches. MP3 player headphones with magnets should not be used. Some devices are safe to use if held at least 12 inches (30 cm) from your Pacemaker. These include power tools, lawn mowers, and speakers. If you are unsure if something is safe to use, ask your health care provider.   When using your cell phone, hold it to the ear that is on the opposite side from the Pacemaker. Do not leave your cell phone in a pocket over the Pacemaker.   You may safely use electric blankets,  heating pads, computers, and microwave ovens.  Call the office right away if:  You have chest pain.  You feel more short of breath than you have felt before.  You feel more light-headed than you have felt before.  Your incision starts to open up.  This information is not intended to replace advice given to you by your health care provider. Make sure you discuss any questions you have with your health care provider.

## 2020-06-20 NOTE — H&P (Signed)
PCP: [name], MD Primary Cardiologist: Dr Einar Gip Primary EP:  Dr Rayann Heman  Kevin Levine is a 86 y.o. male who presents today for electrophysiology followup.   Remote this am revealed atrial lead dislodgement. The patient is doing very well.  Today, he denies symptoms of palpitations, chest pain, shortness of breath,  lower extremity edema, dizziness, presyncope, or syncope.  The patient is otherwise without complaint today.       Past Medical History:  Diagnosis Date  . Arthritis   . Chicken pox   . Colon polyps   . Coronary artery disease   . ED (erectile dysfunction)   . Essential hypertension   . Gout   . Heart disease   . Hyperlipidemia   . Kidney disease   . Seasonal allergies         Past Surgical History:  Procedure Laterality Date  . APPENDECTOMY    . CATARACT EXTRACTION Bilateral   . CORONARY ANGIOPLASTY WITH STENT PLACEMENT  2016  . PACEMAKER IMPLANT N/A 06/15/2020   Procedure: PACEMAKER IMPLANT;  Surgeon: Thompson Grayer, MD;  Location: [facility];  Service: Cardiovascular;  Laterality: N/A;  . Status post ballon angioplasty of the left circumflex and drug eluting stent to the distal left main into the proximal LAD   03/14/2015    ROS- all systems are reviewed and negative except as per HPI above        Current Outpatient Medications  Medication Sig Dispense Refill  . allopurinol (ZYLOPRIM) 100 MG tablet Take 1 tablet (100 mg total) by mouth daily. 90 tablet 3  . amLODipine (NORVASC) 5 MG tablet Take 5 mg by mouth daily.    Marland Kitchen aspirin EC 81 MG tablet Take 81 mg by mouth daily.    Marland Kitchen atorvastatin (LIPITOR) 80 MG tablet Take 1 tablet (80 mg total) by mouth daily. 90 tablet 3  . diphenhydrAMINE (BENADRYL) 25 MG tablet Take 1 tablet by mouth as needed for sleep.    . diphenhydramine-acetaminophen (TYLENOL PM EXTRA STRENGTH) 25-500 MG TABS tablet Take 1 tablet by mouth as needed for sleep.    Marland Kitchen doxazosin (CARDURA) 2  MG tablet TAKE 1 TABLET DAILY 90 tablet 3  . ezetimibe (ZETIA) 10 MG tablet Take 10 mg by mouth daily after supper.   1  . ferrous sulfate 325 (65 FE) MG tablet Take 325 mg by mouth every other day.    . loratadine (CLARITIN) 10 MG tablet Take 10 mg by mouth daily as needed for allergies.     Marland Kitchen losartan (COZAAR) 100 MG tablet Take 1 tablet (100 mg total) by mouth daily. 90 tablet 1  . nitroGLYCERIN (NITROSTAT) 0.4 MG SL tablet Place 0.4 mg under the tongue every 5 (five) minutes as needed.   4  . zolpidem (AMBIEN) 5 MG tablet Take 5 mg by mouth at bedtime as needed for sleep.     No current facility-administered medications for this visit.    Physical Exam: There were no vitals filed for this visit.  GEN- The patient is well appearing, alert and oriented x 3 today.   Head- normocephalic, atraumatic Eyes-  Sclera clear, conjunctiva pink Ears- hearing intact Oropharynx- clear Lungs-   normal work of breathing Chest- pacemaker pocket is without hemtaoma Heart- Regular rate and rhythm  GI- soft  Extremities- no clubbing, cyanosis, or edema  Pacemaker interrogation- reviewed in detail today,  See PACEART report   Assessment and Plan:  1. Symptomatic mobitz II second  degree AV block Atrial lead is dislodged This is confirmed on CXR. See Claudia Desanctis Art report No changes today he is not device dependant today We will schedule for lead revision with Dr Lovena Le on Monday.   Risks, benefits and potential toxicities for medications prescribed and/or refilled reviewed with patient today.   Thompson Grayer MD, [facility] 06/16/2020 12:12 PM  EP Attending  Patient seen and examined. Agree with above. I have discussed the situation with Dr. Greggory Brandy. He has had an early atrial lead dislodgement. I have reviewed the indications/risks/benefits/goals/expectations of atrial lead revision and he wishes to proceed.   Carleene Overlie Dominigue Gellner,MD

## 2020-06-21 ENCOUNTER — Encounter (HOSPITAL_COMMUNITY): Payer: Self-pay | Admitting: Internal Medicine

## 2020-06-22 ENCOUNTER — Encounter: Payer: Self-pay | Admitting: Cardiology

## 2020-06-22 DIAGNOSIS — Z45018 Encounter for adjustment and management of other part of cardiac pacemaker: Secondary | ICD-10-CM

## 2020-06-22 DIAGNOSIS — I441 Atrioventricular block, second degree: Secondary | ICD-10-CM | POA: Insufficient documentation

## 2020-06-22 DIAGNOSIS — Z950 Presence of cardiac pacemaker: Secondary | ICD-10-CM

## 2020-06-22 HISTORY — DX: Presence of cardiac pacemaker: Z95.0

## 2020-06-22 HISTORY — DX: Encounter for adjustment and management of other part of cardiac pacemaker: Z45.018

## 2020-06-22 HISTORY — DX: Atrioventricular block, second degree: I44.1

## 2020-06-28 ENCOUNTER — Ambulatory Visit: Payer: Medicare Other

## 2020-06-30 ENCOUNTER — Ambulatory Visit (INDEPENDENT_AMBULATORY_CARE_PROVIDER_SITE_OTHER): Payer: Medicare Other | Admitting: Emergency Medicine

## 2020-06-30 ENCOUNTER — Other Ambulatory Visit: Payer: Self-pay

## 2020-06-30 DIAGNOSIS — I441 Atrioventricular block, second degree: Secondary | ICD-10-CM | POA: Diagnosis not present

## 2020-06-30 LAB — CUP PACEART INCLINIC DEVICE CHECK
Battery Remaining Longevity: 139 mo
Battery Voltage: 2.99 V
Brady Statistic RA Percent Paced: 3.7 %
Brady Statistic RV Percent Paced: 0.04 %
Date Time Interrogation Session: 20220106100110
Implantable Lead Implant Date: 20211222
Implantable Lead Implant Date: 20211227
Implantable Lead Location: 753859
Implantable Lead Location: 753860
Implantable Pulse Generator Implant Date: 20211222
Lead Channel Impedance Value: 475 Ohm
Lead Channel Impedance Value: 550 Ohm
Lead Channel Pacing Threshold Amplitude: 0.625 V
Lead Channel Pacing Threshold Amplitude: 0.75 V
Lead Channel Pacing Threshold Pulse Width: 0.4 ms
Lead Channel Pacing Threshold Pulse Width: 0.5 ms
Lead Channel Sensing Intrinsic Amplitude: 12 mV
Lead Channel Sensing Intrinsic Amplitude: 2.9 mV
Lead Channel Setting Pacing Amplitude: 0.875
Lead Channel Setting Pacing Amplitude: 3.5 V
Lead Channel Setting Pacing Pulse Width: 0.4 ms
Lead Channel Setting Sensing Sensitivity: 2 mV
Pulse Gen Model: 2272
Pulse Gen Serial Number: 3884182

## 2020-06-30 NOTE — Progress Notes (Signed)
Wound check appointment after lead revision. Steri-strips removed. Wound without redness or edema. Incision edges approximated, wound well healed. Normal device function. Thresholds, sensing, and impedances consistent with implant measurements. Device programmed at 3.5V/auto capture programmed on for extra safety margin until 3 month visit. Histogram distribution appropriate for patient and level of activity. No mode switches or high ventricular rates noted. Patient educated about wound care, arm mobility, lifting restrictions. ROV with Dr Johney Frame 09/21/20. Dr Jacinto Halim will follow remotes.

## 2020-07-27 ENCOUNTER — Ambulatory Visit
Admission: RE | Admit: 2020-07-27 | Discharge: 2020-07-27 | Disposition: A | Discharge status: Home / Self Care | Payer: Medicare Other | Source: Ambulatory Visit | Attending: Internal Medicine | Admitting: Internal Medicine

## 2020-07-27 ENCOUNTER — Other Ambulatory Visit: Payer: Self-pay | Admitting: Internal Medicine

## 2020-07-27 DIAGNOSIS — M79671 Pain in right foot: Secondary | ICD-10-CM

## 2020-07-27 DIAGNOSIS — M79674 Pain in right toe(s): Secondary | ICD-10-CM

## 2020-07-27 DIAGNOSIS — M7731 Calcaneal spur, right foot: Secondary | ICD-10-CM | POA: Diagnosis not present

## 2020-07-27 DIAGNOSIS — M109 Gout, unspecified: Secondary | ICD-10-CM | POA: Diagnosis not present

## 2020-07-27 DIAGNOSIS — M19071 Primary osteoarthritis, right ankle and foot: Secondary | ICD-10-CM | POA: Diagnosis not present

## 2020-07-27 IMAGING — DX DG FOOT 2V*R*
2 series · 2 of 2 positions shown · non-contrast
Comparison: None.

CLINICAL DATA: Right foot pain.

EXAM:
RIGHT FOOT - 2 VIEW

[dg foot 2 views right (1 of 2)]
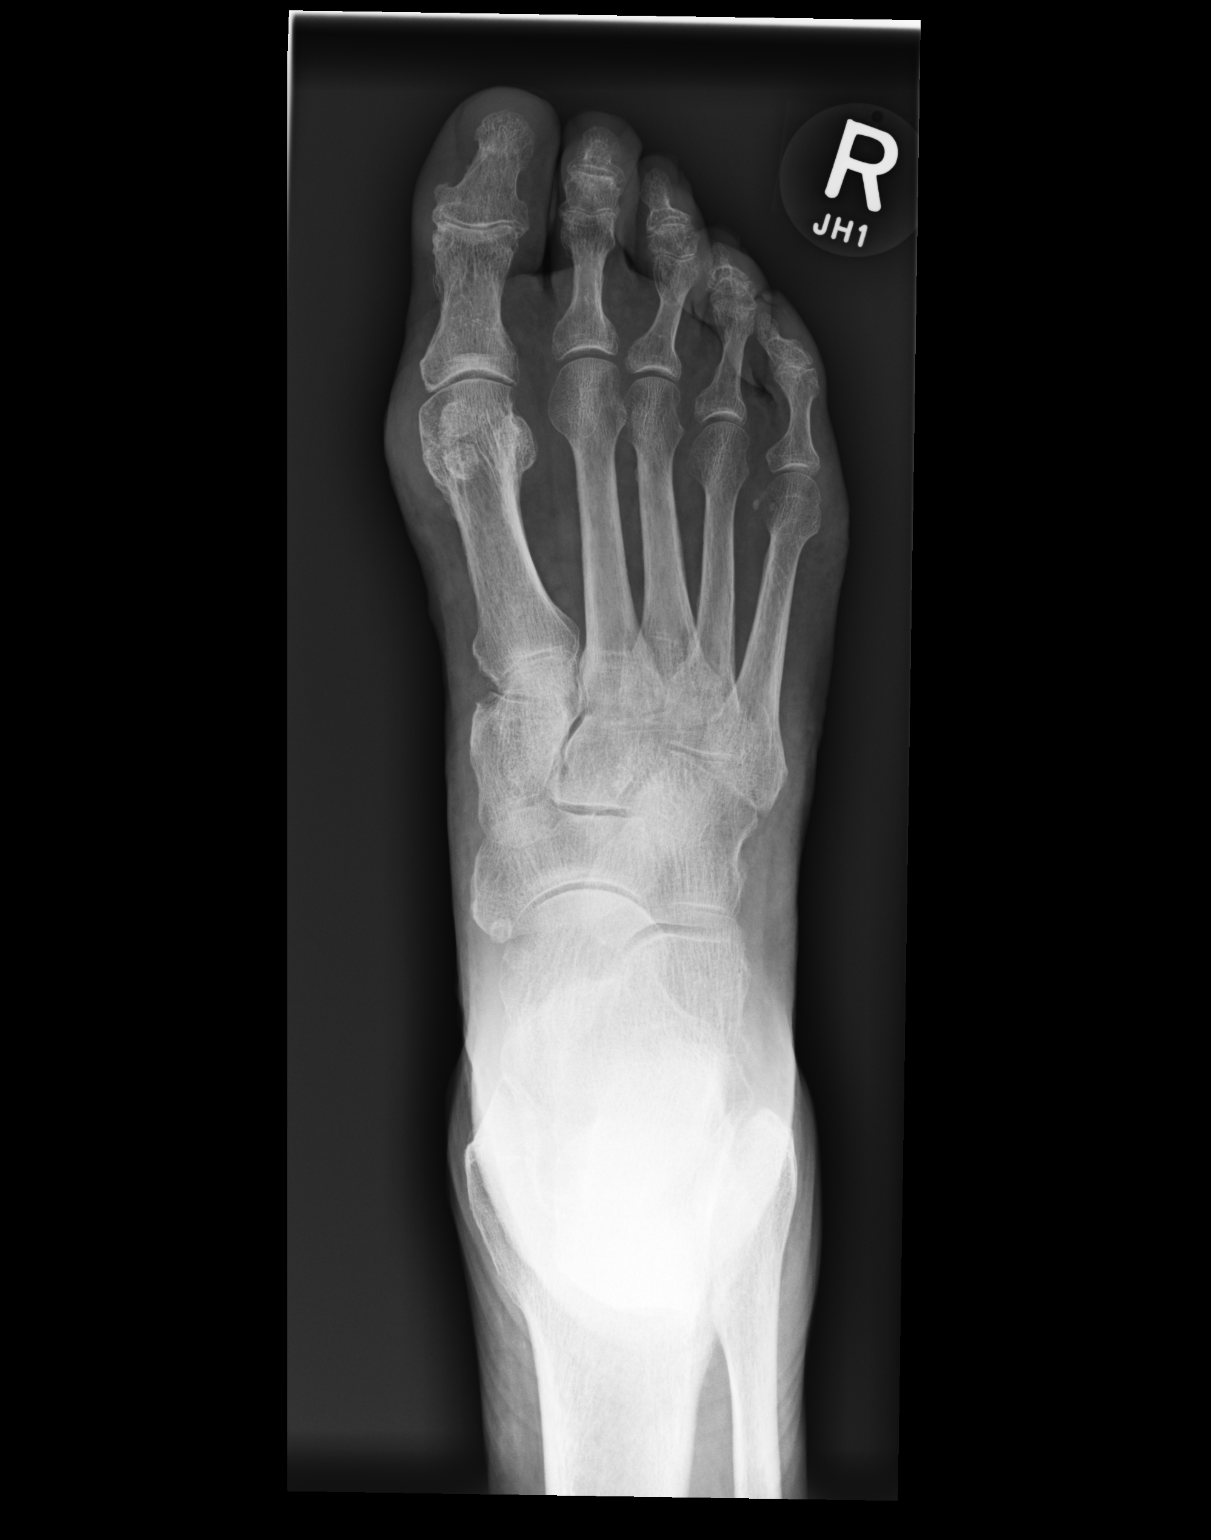

[dg foot 2 views right (2 of 2)]
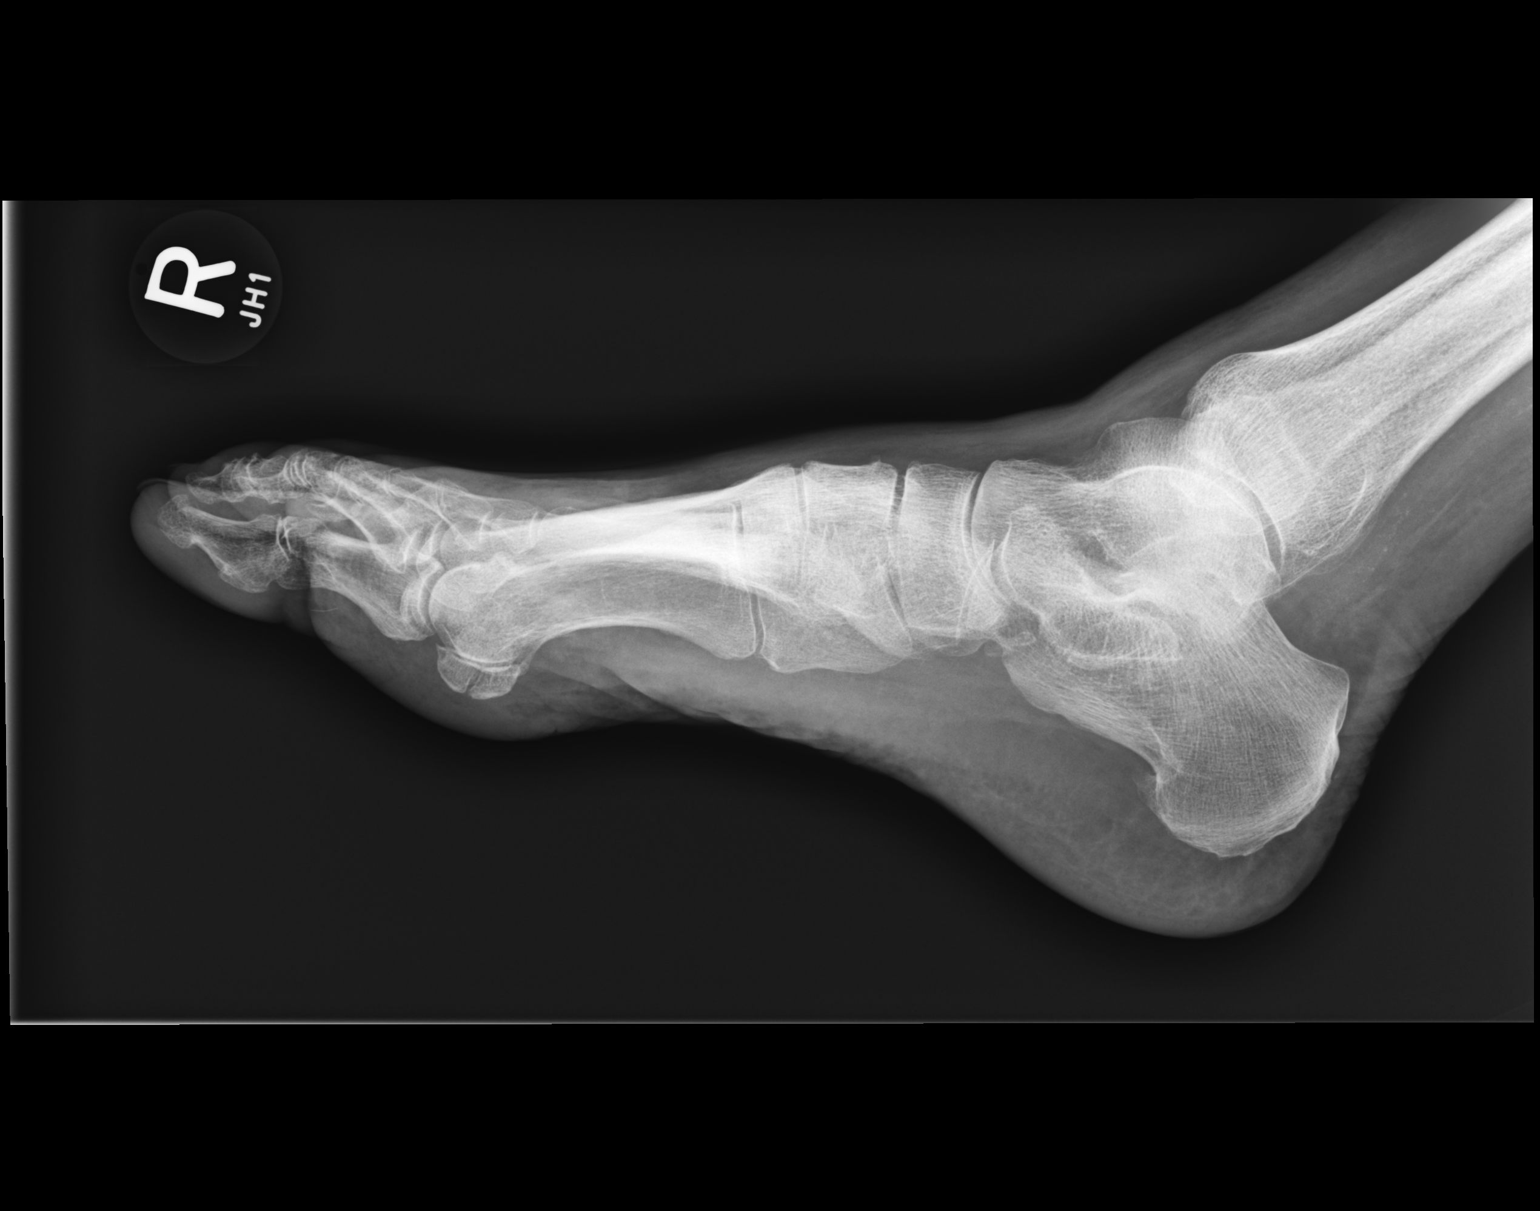

[2 of 2 positions shown; findings below may reference images not displayed]

FINDINGS: No acute fracture or dislocation. No aggressive osseous lesion.
Normal alignment. Mild osteoarthritis of the first MTP joint and
first IP joint. Tiny plantar calcaneal spur.

Soft tissue are unremarkable. No radiopaque foreign body or soft
tissue emphysema.
IMPRESSION: No acute osseous injury of the right foot.

## 2020-08-22 DIAGNOSIS — Z86010 Personal history of colonic polyps: Secondary | ICD-10-CM | POA: Diagnosis not present

## 2020-08-22 DIAGNOSIS — Z950 Presence of cardiac pacemaker: Secondary | ICD-10-CM | POA: Diagnosis not present

## 2020-08-25 ENCOUNTER — Ambulatory Visit: Payer: TRICARE For Life (TFL) | Admitting: Cardiology

## 2020-08-31 ENCOUNTER — Other Ambulatory Visit: Payer: Self-pay | Admitting: Internal Medicine

## 2020-08-31 ENCOUNTER — Ambulatory Visit
Admission: RE | Admit: 2020-08-31 | Discharge: 2020-08-31 | Disposition: A | Discharge status: Home / Self Care | Payer: Medicare Other | Source: Ambulatory Visit | Attending: Internal Medicine | Admitting: Internal Medicine

## 2020-08-31 DIAGNOSIS — R0781 Pleurodynia: Secondary | ICD-10-CM | POA: Diagnosis not present

## 2020-08-31 IMAGING — CR DG RIBS W/ CHEST 3+V*L*
3 series · 3 of 3 positions shown · non-contrast
Comparison: [DATE]

CLINICAL DATA: Left rib pain

EXAM:
LEFT RIBS AND CHEST - 3+ VIEW

[w chest pa *]
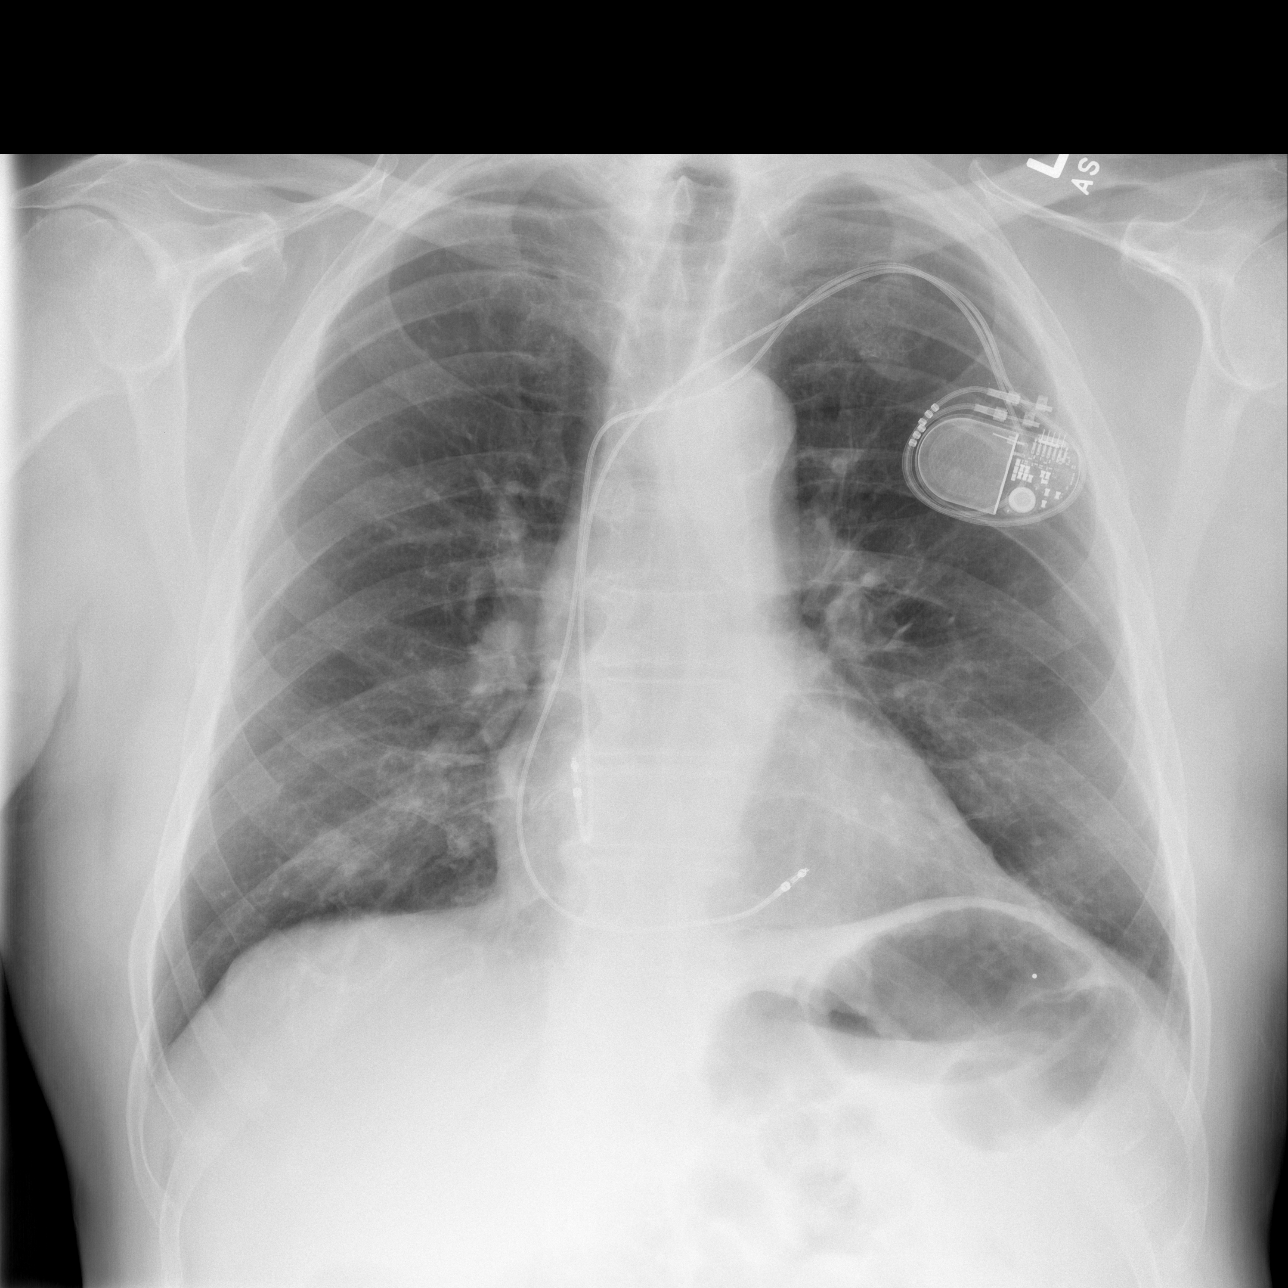

[w ribs ap/pa upper left *]
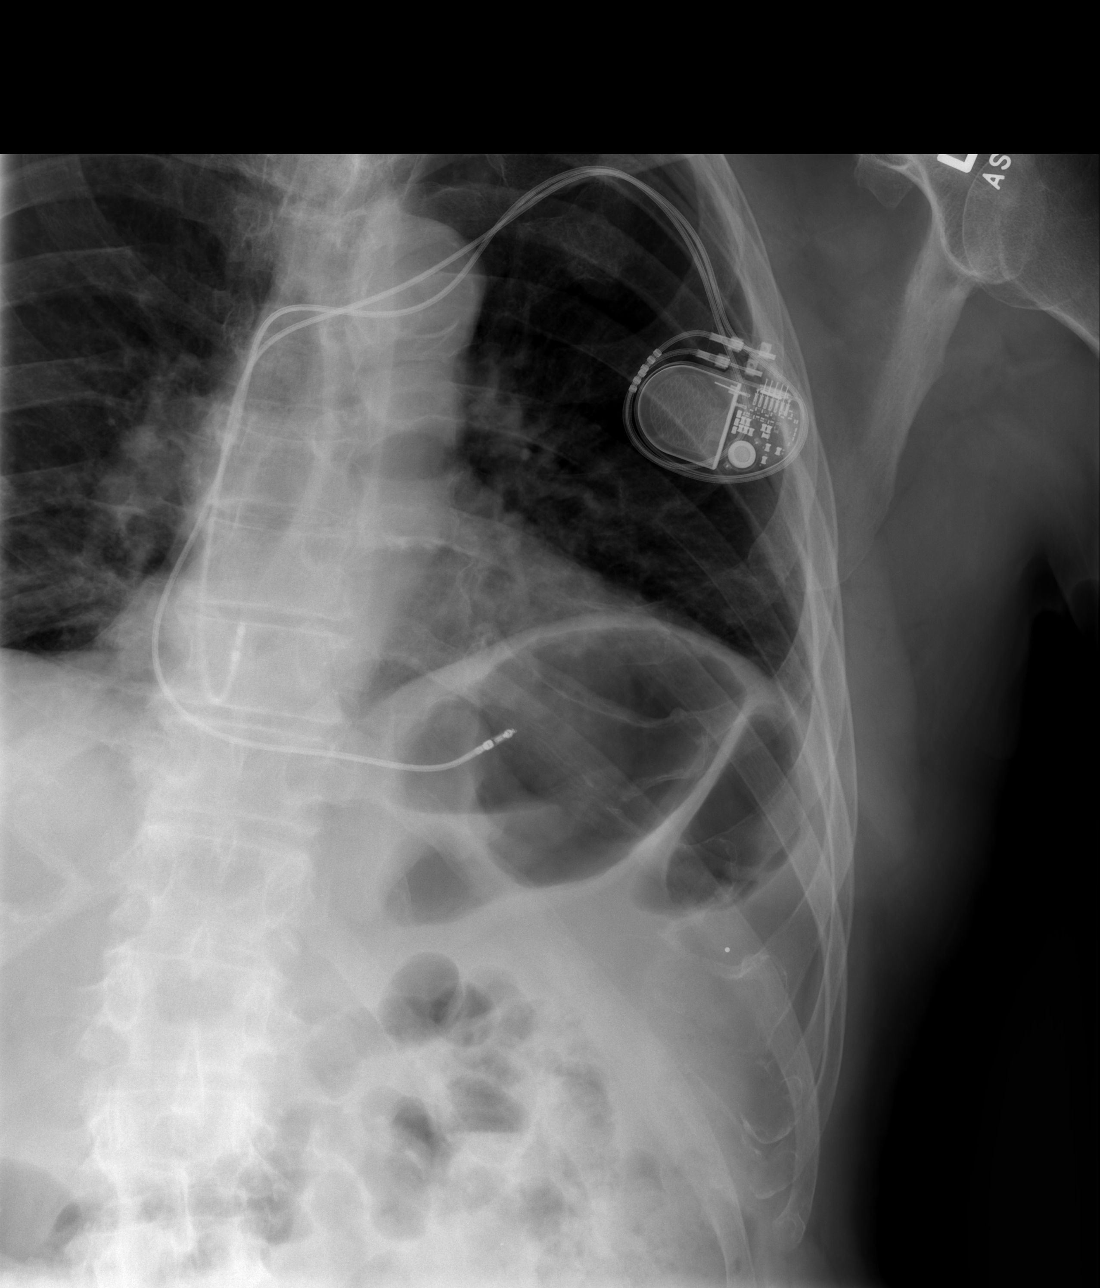

[w ribs oblique left *]
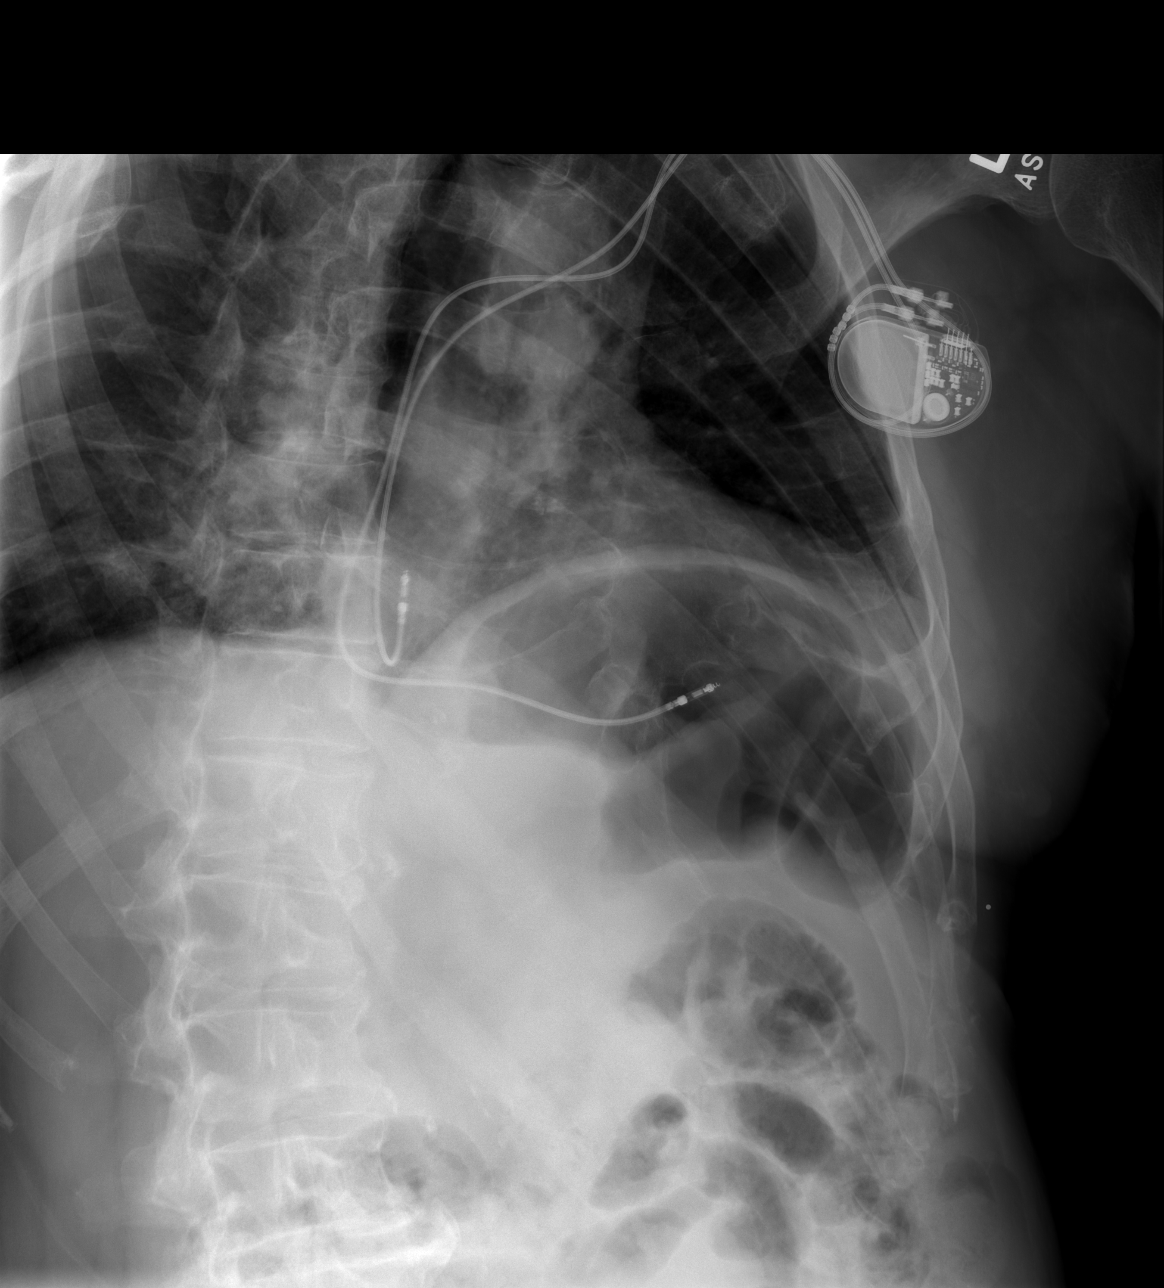

[3 of 3 positions shown; findings below may reference images not displayed]

FINDINGS: No fracture or other bone lesions are seen involving the ribs. There
is no evidence of pneumothorax or pleural effusion. Both lungs are
clear. Heart size and mediastinal contours are within normal limits.
Dual lead pacemaker unchanged from the prior study.
IMPRESSION: Negative.

## 2020-09-11 NOTE — Progress Notes (Signed)
Primary Physician/Referring:  [name], MD  Patient ID: Kevin Levine, male    DOB: 1931/07/30, 87 y.o.   MRN: [id number]  Chief Complaint  Patient presents with  . Coronary Artery Disease  . Follow-up    6 month   HPI:    Kevin Levine  is a 87 y.o. Caucasian male with CAD status post balloon angioplasty of left circumflex and stenting to distal left main and the proximal LAD in September 2016, hypertension, hyperlipidemia, stable stage 3 chronic kidney disease, and gout. He has chronic bilateral leg edema due to varicose veins, has been wearing support stockings. He has mild abdominal aortic ectasia by duplex.   He underwent outpatient extended EKG monitoring for 48 hours on 05/04/2020, due to syncope, found to have Mobitz 2 AV block, hence underwent dual-chamber pacemaker implantation with [facility] on 06/15/2020.  No further episodes of syncope or dizziness.  This is a 28-monthoffice visit.  He remains asymptomatic, but does understand due to aging he has slowed down some in general.  He has no specific complaints today.    Past Medical History:  Diagnosis Date  . Arthritis   . Chicken pox   . Colon polyps   . Coronary artery disease   . ED (erectile dysfunction)   . Encounter for care of pacemaker 06/22/2020  . Essential hypertension   . Gout   . Heart disease   . Hyperlipidemia   . Kidney disease   . Pacemaker[place] [facility] [place]  dual-chamber pacemaker 06/15/2020 06/22/2020  . Seasonal allergies   . Second degree Mobitz II AV block 06/22/2020   Past Surgical History:  Procedure Laterality Date  . APPENDECTOMY    . CATARACT EXTRACTION Bilateral   . CORONARY ANGIOPLASTY WITH STENT PLACEMENT  2016  . LEAD REVISION/REPAIR N/A 06/20/2020   Procedure: LEAD REVISION/REPAIR;  Surgeon: TEvans Lance MD;  [facility];  Service: Cardiovascular;  Laterality: N/A;  . PACEMAKER IMPLANT N/A 06/15/2020   Procedure: PACEMAKER IMPLANT;   Surgeon: AThompson Grayer MD;  [facility];  Service: Cardiovascular;  Laterality: N/A;  . Status post ballon angioplasty of the left circumflex and drug eluting stent to the distal left main into the proximal LAD   03/14/2015   Social History   Tobacco Use  . Smoking status: Never Smoker  . Smokeless tobacco: Never Used  Substance Use Topics  . Alcohol use: Yes    Alcohol/week: 21.0 standard drinks    Types: 14 Glasses of wine, 7 Standard drinks or equivalent per week    Comment: OCCASIONALLY   Marital Status: Married  ROS  Review of Systems  Constitutional: Negative for malaise/fatigue.  Cardiovascular: Positive for dyspnea on exertion (stable and minimal). Negative for chest pain, leg swelling, orthopnea, palpitations, paroxysmal nocturnal dyspnea and syncope.  Gastrointestinal: Negative for melena.  Neurological: Negative for dizziness.   Objective  Blood pressure (!) 149/78, pulse 67, temperature 98.7 F (37.1 C), temperature source Temporal, resp. rate 17, height _0  (1.803 m), weight 217 lb (98.4 kg), SpO2 98 %.  Vitals with BMI 09/12/2020 06/20/2020 06/20/2020  Height _1  - -  Weight 217 lbs - -  BMI 397.67- -  Systolic [id number] Diastolic 78 140972  Pulse 67 61 73     Physical Exam Constitutional:      General: He is not in acute distress.    Appearance: He is well-developed.  Neck:  Vascular: No JVD.  Cardiovascular:     Rate and Rhythm: Normal rate and regular rhythm.     Pulses: Intact distal pulses.     Heart sounds: Normal heart sounds. No murmur heard. No gallop.      Comments: Trace edema bilaterally. No JVD. Pulmonary:     Effort: Pulmonary effort is normal.     Breath sounds: Normal breath sounds.  Chest:     Comments: Pacemaker/ICD site noted  in the left infraclavicular fossa.     Laboratory examination:   External labs :   Labs 05/03/2020:  Serum glucose 134 mg, BUN 26, creatinine 1.43, EGFR 47 mL, potassium  4.6.  Hb 12.4/HCT 37.0, platelets 243.  A1c 6.3%.  Labs 04/06/2020:  Total cholesterol 117, triglycerides 114, HDL 47, LDL 50.  Cholesterol, total 113.000 07/24/2019 HDL 49.000 07/24/2019 LDL-C 31.000 07/24/2019 Triglycerides 167.000 07/24/2019   A1C 6.200 07/24/2019 TSH 2.750 12/24/2019   Hemoglobin 12.400 12/24/2019  Creatinine, Serum 1.440 12/24/2019 Potassium 4.300 12/24/2019 Magnesium N/D ALT (SGPT) 18.000 03/30/2019   12/28/2017: Total cholesterol 113, triglycerides 77, HDL 47, LDL 51 mg. Serum glucose 112 mg, BUN 22, creatinine 1.64, eGFR 38/44 mL, potassium 4.4. CMP otherwise normal.  05/23/2017: Cholesterol 180, triglycerides 152, HDL 44, LDL 105. TSH 2.6. CBC normal. Creatinine 1.48, potassium 4.3, EGFR of 48, BMP otherwise normal. Hepatic function panel normal.  Medications and allergies  No Known Allergies  Current Outpatient Medications on File Prior to Visit  Medication Sig Dispense Refill  . allopurinol (ZYLOPRIM) 100 MG tablet Take 1 tablet (100 mg total) by mouth daily. 90 tablet 3  . amLODipine (NORVASC) 5 MG tablet Take 5 mg by mouth daily.    Marland Kitchen aspirin EC 81 MG tablet Take 81 mg by mouth daily.    Marland Kitchen atorvastatin (LIPITOR) 80 MG tablet Take 1 tablet (80 mg total) by mouth daily. (Patient taking differently: Take 80 mg by mouth at bedtime.) 90 tablet 3  . diphenhydramine-acetaminophen (TYLENOL PM EXTRA STRENGTH) 25-500 MG TABS tablet Take 1 tablet by mouth at bedtime as needed for sleep.    Marland Kitchen doxazosin (CARDURA) 2 MG tablet TAKE 1 TABLET DAILY (Patient taking differently: Take 2 mg by mouth at bedtime.) 90 tablet 3  . ezetimibe (ZETIA) 10 MG tablet Take 10 mg by mouth at bedtime.  1  . loratadine (CLARITIN) 10 MG tablet Take 10 mg by mouth daily as needed for allergies.     Marland Kitchen losartan (COZAAR) 100 MG tablet Take 1 tablet (100 mg total) by mouth daily. 90 tablet 1  . metoprolol succinate (TOPROL-XL) 25 MG 24 hr tablet Take 25 mg by mouth daily.    . Multiple  Vitamins-Minerals (MULTIVITAMIN WITH MINERALS) tablet Take 1 tablet by mouth daily.    . nitroGLYCERIN (NITROSTAT) 0.4 MG SL tablet Place 0.4 mg under the tongue every 5 (five) minutes as needed for chest pain.  4  . zolpidem (AMBIEN) 5 MG tablet Take 5 mg by mouth at bedtime.     No current facility-administered medications on file prior to visit.    Radiology:  No results found.  Cardiac Studies:   Coronary Angiography 11/01/2014: S/P resolute DES 3.5 x 18 mm stent into the distal left main into the LAD and balloon angioplasty to circumflex arteon   Echocardiogram 10/16/2017: Left ventricle cavity is normal in size. Mild concentric hypertrophy of the left ventricle. Normal global wall motion. Normal diastolic filling pattern. Calculated EF 55%. Mild tricuspid regurgitation. No evidence of pulmonary hypertension.  Abdominal aortic duplex 10/16/2017: Mild diffuse calcific plaque noted in abdominal aorta. An abdominal aortic aneurysm measuring 2.85 x 2.82 x 2.73 cm is seen in mid aorta. There is mild ectasia of the abdominal aorta. The bilateral iliac arteries are also dilated at 15 mm. Normal velocity. Recheck in 2 years.  Pulse oximetry, overnight 03/12/2019: Study suggest very mild nocturnal hypoxemia.  ABI 11/17/2019: Right: Resting right ankle-brachial index is within normal range. No  evidence of significant right lower extremity arterial disease. The right  toe-brachial index is normal. RT great toe pressure = 151 mmHg.  Left: Resting left ankle-brachial index indicates noncompressible left  lower extremity arteries. The left toe-brachial index is normal. LT Great  toe pressure = 179 mmHg.   Echocardiogram 01/22/2020:  Normal LV systolic function with visual EF 50-55%. Left ventricle cavity is normal in size. Mild left ventricular hypertrophy. Normal global wall motion. Indeterminate diastolic filling pattern, normal LAP. Calculated EF 47%.  Left atrial cavity is moderately  dilated.  Mild to moderate tricuspid regurgitation. No evidence of pulmonary hypertension. RVSP measures 33 mmHg.  IVC is dilated with a respiratory response of >50%.  Compared to previous study 10/16/2017 no significant change.   Exercise tetrofosmin stress test  02/01/2020: Normal ECG stress. The patient exercised for 4 minutes and 30 seconds of a Bruce protocol, achieving approximately 6.42 METs.  Normal BP response.  Low level exercise tolerance. PVC and V-Bigeminy and rare PVC in recovery.  Myocardial perfusion is normal. Overall LV systolic function is normal without regional wall motion abnormalities. Stress LV EF: 52%.  No significant change from 10/07/2017. No change in exercise tolerance. Low risk.   Zio Patch Extended out patient EKG monitoring 2 days 05/04/2020:  There were no patient activated events.  Minimum heart rate 57 and maximum heart rate 107 bpm, predominantly sinus rhythm. 2 brief atrial tachycardia has occurred.  Longest 8 beats.  Rare PVCs and PACs.  There was no heart block, no atrial fibrillation. Patient triggered events (2) revealed normal sinus rhythm and rare PACs.  Device Clinic: [employer]  dual-chamber pacemaker 06/15/2020   Scheduled Remote pacemaker check  06/15/2020: APs 2.4%, RV paced 1%, longevity >9 years.  Normal pacemaker function.   EKG  EKG 05/04/2020: Sinus rhythm with first-degree AV block, borderline at rate of 68 bpm, left atrial enlargement, left axis deviation, left anterior fascicular block.  Incomplete right bundle branch block.  IVCD, borderline criteria for LVH.  Poor R wave progression, probably normal variant.  No evidence of ischemia.  Normal QT interval.    EKG  10/15/2019: NSR, LAE, LAD, LAFB. IRBBB. Normal QT. No ischemia.  No significant change from EKG 03/10/2019.  Assessment     ICD-10-CM   1. Atherosclerosis of native coronary artery of native heart with angina pectoris ([place])  I25.119   2. Second degree  Mobitz II AV block  I44.1   3. Pacemaker: [place] [employer]  dual-chamber pacemaker 06/15/2020  Z95.0   4. Essential hypertension  I10     No orders of the defined types were placed in this encounter.  Medications Discontinued During This Encounter  Medication Reason  . ferrous sulfate 325 (65 FE) MG tablet Error    No orders of the defined types were placed in this encounter.   Recommendations:   Elisah Parmer  is a 87 y.o. Caucasian male with CAD status post balloon angioplasty of left circumflex and stenting to distal left main and the proximal  LAD in September 2016, hypertension, hyperlipidemia, stable stage 3 chronic kidney disease, and gout. 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   He underwent outpatient extended EKG monitoring for 48 hours on 05/04/2020, due to syncope, found to have Mobitz 2 AV block, hence underwent dual-chamber pacemaker implantation with [facility] on 06/15/2020.  No further episodes of syncope or dizziness.  This is a 24-monthoffice visit.  He remains asymptomatic, he has not had any angina, he has not had any dizziness or syncope since pacemaker implantation.  His transmission is scheduled in 2 days.  Blood pressure is well controlled.  Renal function has remained stable, lipids are normal and external labs reviewed.  No clinical evidence heart failure.  I will see him back in 6 months for follow-up.   JAdrian Prows MD, [facility]3/21/2022, 3:19 PM Office: [phone]

## 2020-09-12 ENCOUNTER — Ambulatory Visit: Payer: Medicare Other | Admitting: Cardiology

## 2020-09-12 ENCOUNTER — Other Ambulatory Visit: Payer: Self-pay

## 2020-09-12 ENCOUNTER — Encounter: Payer: Self-pay | Admitting: Cardiology

## 2020-09-12 VITALS — BP 149/78 | HR 67 | Temp 98.7°F | Resp 17 | Ht 71.0 in | Wt 217.0 lb

## 2020-09-12 DIAGNOSIS — I25119 Atherosclerotic heart disease of native coronary artery with unspecified angina pectoris: Secondary | ICD-10-CM | POA: Diagnosis not present

## 2020-09-12 DIAGNOSIS — Z950 Presence of cardiac pacemaker: Secondary | ICD-10-CM

## 2020-09-12 DIAGNOSIS — I10 Essential (primary) hypertension: Secondary | ICD-10-CM | POA: Diagnosis not present

## 2020-09-12 DIAGNOSIS — I441 Atrioventricular block, second degree: Secondary | ICD-10-CM | POA: Diagnosis not present

## 2020-09-19 ENCOUNTER — Encounter: Payer: Medicare Other | Admitting: Internal Medicine

## 2020-09-21 ENCOUNTER — Other Ambulatory Visit: Payer: Self-pay

## 2020-09-21 ENCOUNTER — Ambulatory Visit (INDEPENDENT_AMBULATORY_CARE_PROVIDER_SITE_OTHER): Payer: Medicare Other | Admitting: Internal Medicine

## 2020-09-21 VITALS — BP 138/68 | HR 62 | Wt 217.6 lb

## 2020-09-21 DIAGNOSIS — I10 Essential (primary) hypertension: Secondary | ICD-10-CM | POA: Diagnosis not present

## 2020-09-21 DIAGNOSIS — I25119 Atherosclerotic heart disease of native coronary artery with unspecified angina pectoris: Secondary | ICD-10-CM

## 2020-09-21 DIAGNOSIS — E7800 Pure hypercholesterolemia, unspecified: Secondary | ICD-10-CM | POA: Diagnosis not present

## 2020-09-21 DIAGNOSIS — I441 Atrioventricular block, second degree: Secondary | ICD-10-CM | POA: Diagnosis not present

## 2020-09-21 NOTE — Patient Instructions (Addendum)
Medication Instructions:  Your physician recommends that you continue on your current medications as directed. Please refer to the Current Medication list given to you today.  Labwork: None ordered.  Testing/Procedures: None ordered.  Follow-Up: Your physician wants you to follow-up in: As needed with Dr. Thompson Grayer.   Any Other Special Instructions Will Be Listed Below (If Applicable).  If you need a refill on your cardiac medications before your next appointment, please call your pharmacy.

## 2020-09-21 NOTE — Progress Notes (Signed)
PCP: [name], MD Primary Cardiologist: Dr Einar Gip Primary EP:  Dr Rayann Heman  Lowell Guitar is a 87 y.o. male who presents today for routine electrophysiology followup.  Since his PPMP implant, the patient reports doing very well.  Today, he denies symptoms of palpitations, chest pain, shortness of breath,  lower extremity edema, dizziness, presyncope, or syncope.  The patient is otherwise without complaint today.   Past Medical History:  Diagnosis Date  . Arthritis   . Chicken pox   . Colon polyps   . Coronary artery disease   . ED (erectile dysfunction)   . Encounter for care of pacemaker 06/22/2020  . Essential hypertension   . Gout   . Heart disease   . Hyperlipidemia   . Kidney disease   . Pacemaker: [place] [employer]  dual-chamber pacemaker 06/15/2020 06/22/2020  . Seasonal allergies   . Second degree Mobitz II AV block 06/22/2020   Past Surgical History:  Procedure Laterality Date  . APPENDECTOMY    . CATARACT EXTRACTION Bilateral   . CORONARY ANGIOPLASTY WITH STENT PLACEMENT  2016  . LEAD REVISION/REPAIR N/A 06/20/2020   Procedure: LEAD REVISION/REPAIR;  Surgeon: Evans Lance, MD;  Location: [place] [facility];  Service: Cardiovascular;  Laterality: N/A;  . PACEMAKER IMPLANT N/A 06/15/2020   Procedure: PACEMAKER IMPLANT;  Surgeon: Thompson Grayer, MD;  Location: [place] [facility];  Service: Cardiovascular;  Laterality: N/A;  . Status post ballon angioplasty of the left circumflex and drug eluting stent to the distal left main into the proximal LAD   03/14/2015    ROS- all systems are reviewed and negative except as per HPI above  Current Outpatient Medications  Medication Sig Dispense Refill  . allopurinol (ZYLOPRIM) 100 MG tablet Take 1 tablet (100 mg total) by mouth daily. 90 tablet 3  . amLODipine (NORVASC) 5 MG tablet Take 5 mg by mouth daily.    Marland Kitchen aspirin EC 81 MG tablet Take 81 mg by mouth daily.    Marland Kitchen atorvastatin (LIPITOR) 80 MG tablet  Take 1 tablet (80 mg total) by mouth daily. (Patient taking differently: Take 80 mg by mouth at bedtime.) 90 tablet 3  . diphenhydramine-acetaminophen (TYLENOL PM EXTRA STRENGTH) 25-500 MG TABS tablet Take 1 tablet by mouth at bedtime as needed for sleep.    Marland Kitchen doxazosin (CARDURA) 2 MG tablet TAKE 1 TABLET DAILY (Patient taking differently: Take 2 mg by mouth at bedtime.) 90 tablet 3  . ezetimibe (ZETIA) 10 MG tablet Take 10 mg by mouth at bedtime.  1  . loratadine (CLARITIN) 10 MG tablet Take 10 mg by mouth daily as needed for allergies.     Marland Kitchen losartan (COZAAR) 100 MG tablet Take 1 tablet (100 mg total) by mouth daily. 90 tablet 1  . metoprolol succinate (TOPROL-XL) 25 MG 24 hr tablet Take 25 mg by mouth daily.    . Multiple Vitamins-Minerals (MULTIVITAMIN WITH MINERALS) tablet Take 1 tablet by mouth daily.    . nitroGLYCERIN (NITROSTAT) 0.4 MG SL tablet Place 0.4 mg under the tongue every 5 (five) minutes as needed for chest pain.  4  . zolpidem (AMBIEN) 5 MG tablet Take 5 mg by mouth at bedtime.     No current facility-administered medications for this visit.    Physical Exam: There were no vitals filed for this visit.  GEN- The patient is well appearing, alert and oriented x 3 today.   Head- normocephalic, atraumatic Eyes-  Sclera clear, conjunctiva  pink Ears- hearing intact Oropharynx- clear Lungs- Clear to ausculation bilaterally, normal work of breathing Chest- pacemaker pocket is well healed Heart- Regular rate and rhythm, no murmurs, rubs or gallops, PMI not laterally displaced GI- soft, NT, ND, + BS Extremities- no clubbing, cyanosis, or edema  Pacemaker interrogation- reviewed in detail today,  See PACEART report  ekg tracing ordered today is personally reviewed and shows sinus with first degree AV block, LAHB  Assessment and Plan:  1. Symptomatic second degree AV  heart block Normal pacemaker function See Pace Art report No changes today he is not device dependant  today  2. HTN Stable No change required today  3. HL Continue statin  He wishes to follow up with Dr Einar Gip going forward I am happy to see as needed  Thompson Grayer MD, [facility] 09/21/2020 2:13 PM

## 2020-09-24 DIAGNOSIS — Z23 Encounter for immunization: Secondary | ICD-10-CM | POA: Diagnosis not present

## 2020-09-26 NOTE — Addendum Note (Signed)
Addended by: Rose Phi on: 09/26/2020 11:46 AM   Modules accepted: Orders

## 2020-10-14 ENCOUNTER — Ambulatory Visit: Payer: TRICARE For Life (TFL) | Admitting: Cardiology

## 2020-10-24 ENCOUNTER — Ambulatory Visit: Payer: TRICARE For Life (TFL) | Admitting: Cardiology

## 2020-10-24 NOTE — Progress Notes (Deleted)
Primary Physician/Referring:  [name], MD  Patient ID: Kevin Levine, male    DOB: 1932/04/04, 87 y.o.   MRN: [id number]  No chief complaint on file.  HPI:    Kevin Levine  is a 87 y.o. Caucasian male with CAD status post balloon angioplasty of left circumflex and stenting to distal left main and the proximal LAD in September 2016, hypertension, hyperlipidemia, stable stage 3 chronic kidney disease, and gout. He has chronic bilateral leg edema due to varicose veins, has been wearing support stockings. He has mild abdominal aortic ectasia by duplex.   He underwent outpatient extended EKG monitoring for 48 hours on 05/04/2020, due to syncope, found to have Mobitz 2 AV block, hence underwent dual-chamber pacemaker implantation with [facility] on 06/15/2020.  No further episodes of syncope or dizziness.  This is a 85-monthoffice visit.  He remains asymptomatic, but does understand due to aging he has slowed down some in general.  He has no specific complaints today.    Past Medical History:  Diagnosis Date  . Arthritis   . Chicken pox   . Colon polyps   . Coronary artery disease   . ED (erectile dysfunction)   . Encounter for care of pacemaker 06/22/2020  . Essential hypertension   . Gout   . Heart disease   . Hyperlipidemia   . Kidney disease   . Pacemaker: [facility]  dual-chamber pacemaker 06/15/2020 06/22/2020  . Seasonal allergies   . Second degree Mobitz II AV block 06/22/2020   Past Surgical History:  Procedure Laterality Date  . APPENDECTOMY    . CATARACT EXTRACTION Bilateral   . CORONARY ANGIOPLASTY WITH STENT PLACEMENT  2016  . LEAD REVISION/REPAIR N/A 06/20/2020   Procedure: LEAD REVISION/REPAIR;  Surgeon: TEvans Lance MD;  [facility];  Service: Cardiovascular;  Laterality: N/A;  . PACEMAKER IMPLANT N/A 06/15/2020   Procedure: PACEMAKER IMPLANT;  Surgeon: AThompson Grayer MD;  [place] [facility];  Service:  Cardiovascular;  Laterality: N/A;  . Status post ballon angioplasty of the left circumflex and drug eluting stent to the distal left main into the proximal LAD   03/14/2015   Social History   Tobacco Use  . Smoking status: Never Smoker  . Smokeless tobacco: Never Used  Substance Use Topics  . Alcohol use: Yes    Alcohol/week: 21.0 standard drinks    Types: 14 Glasses of wine, 7 Standard drinks or equivalent per week    Comment: OCCASIONALLY   Marital Status: Married  ROS  Review of Systems  Constitutional: Negative for malaise/fatigue.  Cardiovascular: Positive for dyspnea on exertion (stable and minimal). Negative for chest pain, leg swelling, orthopnea, palpitations, paroxysmal nocturnal dyspnea and syncope.  Gastrointestinal: Negative for melena.  Neurological: Negative for dizziness.   Objective  There were no vitals taken for this visit.  Vitals with BMI 09/21/2020 09/12/2020 06/20/2020  Height - _0  -  Weight 217 lbs 10 oz 217 lbs -  BMI 317.79339.03-  Systolic [id number] Diastolic 68 78 1622 Pulse 62 67 61     Physical Exam Constitutional:      General: He is not in acute distress.    Appearance: He is well-developed.  Neck:     Vascular: No JVD.  Cardiovascular:     Rate and Rhythm: Normal rate and regular rhythm.     Pulses: Intact distal pulses.     Heart  sounds: Normal heart sounds. No murmur heard. No gallop.      Comments: Trace edema bilaterally. No JVD. Pulmonary:     Effort: Pulmonary effort is normal.     Breath sounds: Normal breath sounds.  Chest:     Comments: Pacemaker/ICD site noted  in the left infraclavicular fossa.     Laboratory examination:   External labs :   Labs 05/03/2020:  Serum glucose 134 mg, BUN 26, creatinine 1.43, EGFR 47 mL, potassium 4.6.  Hb 12.4/HCT 37.0, platelets 243.  A1c 6.3%.  Labs 04/06/2020:  Total cholesterol 117, triglycerides 114, HDL 47, LDL 50.  Cholesterol, total 113.000 07/24/2019 HDL 49.000  07/24/2019 LDL-C 31.000 07/24/2019 Triglycerides 167.000 07/24/2019   A1C 6.200 07/24/2019 TSH 2.750 12/24/2019   Hemoglobin 12.400 12/24/2019  Creatinine, Serum 1.440 12/24/2019 Potassium 4.300 12/24/2019 Magnesium N/D ALT (SGPT) 18.000 03/30/2019   12/28/2017: Total cholesterol 113, triglycerides 77, HDL 47, LDL 51 mg. Serum glucose 112 mg, BUN 22, creatinine 1.64, eGFR 38/44 mL, potassium 4.4. CMP otherwise normal.  05/23/2017: Cholesterol 180, triglycerides 152, HDL 44, LDL 105. TSH 2.6. CBC normal. Creatinine 1.48, potassium 4.3, EGFR of 48, BMP otherwise normal. Hepatic function panel normal.  Medications and allergies  No Known Allergies  Current Outpatient Medications on File Prior to Visit  Medication Sig Dispense Refill  . allopurinol (ZYLOPRIM) 100 MG tablet Take 1 tablet (100 mg total) by mouth daily. 90 tablet 3  . amLODipine (NORVASC) 5 MG tablet Take 5 mg by mouth daily.    Marland Kitchen aspirin EC 81 MG tablet Take 81 mg by mouth daily.    Marland Kitchen atorvastatin (LIPITOR) 80 MG tablet Take 1 tablet (80 mg total) by mouth daily. (Patient taking differently: Take 80 mg by mouth at bedtime.) 90 tablet 3  . diphenhydramine-acetaminophen (TYLENOL PM EXTRA STRENGTH) 25-500 MG TABS tablet Take 1 tablet by mouth at bedtime as needed for sleep.    Marland Kitchen doxazosin (CARDURA) 2 MG tablet TAKE 1 TABLET DAILY (Patient taking differently: Take 2 mg by mouth at bedtime.) 90 tablet 3  . ezetimibe (ZETIA) 10 MG tablet Take 10 mg by mouth at bedtime.  1  . loratadine (CLARITIN) 10 MG tablet Take 10 mg by mouth daily as needed for allergies.     Marland Kitchen losartan (COZAAR) 100 MG tablet Take 1 tablet (100 mg total) by mouth daily. 90 tablet 1  . metoprolol succinate (TOPROL-XL) 25 MG 24 hr tablet Take 25 mg by mouth daily.    . Multiple Vitamins-Minerals (MULTIVITAMIN WITH MINERALS) tablet Take 1 tablet by mouth daily.    . nitroGLYCERIN (NITROSTAT) 0.4 MG SL tablet Place 0.4 mg under the tongue every 5 (five) minutes as needed  for chest pain.  4  . zolpidem (AMBIEN) 5 MG tablet Take 5 mg by mouth at bedtime.     No current facility-administered medications on file prior to visit.    Radiology:  No results found.  Cardiac Studies:   Coronary Angiography 11/01/2014: S/P resolute DES 3.5 x 18 mm stent into the distal left main into the LAD and balloon angioplasty to circumflex arteon   Echocardiogram 10/16/2017: Left ventricle cavity is normal in size. Mild concentric hypertrophy of the left ventricle. Normal global wall motion. Normal diastolic filling pattern. Calculated EF 55%. Mild tricuspid regurgitation. No evidence of pulmonary hypertension.  Abdominal aortic duplex 10/16/2017: Mild diffuse calcific plaque noted in abdominal aorta. An abdominal aortic aneurysm measuring 2.85 x 2.82 x 2.73 cm is seen in mid aorta. There  is mild ectasia of the abdominal aorta. The bilateral iliac arteries are also dilated at 15 mm. Normal velocity. Recheck in 2 years.  Pulse oximetry, overnight 03/12/2019: Study suggest very mild nocturnal hypoxemia.  ABI 11/17/2019: Right: Resting right ankle-brachial index is within normal range. No  evidence of significant right lower extremity arterial disease. The right  toe-brachial index is normal. RT great toe pressure = 151 mmHg.  Left: Resting left ankle-brachial index indicates noncompressible left  lower extremity arteries. The left toe-brachial index is normal. LT Great  toe pressure = 179 mmHg.   Echocardiogram 01/22/2020:  Normal LV systolic function with visual EF 50-55%. Left ventricle cavity is normal in size. Mild left ventricular hypertrophy. Normal global wall motion. Indeterminate diastolic filling pattern, normal LAP. Calculated EF 47%.  Left atrial cavity is moderately dilated.  Mild to moderate tricuspid regurgitation. No evidence of pulmonary hypertension. RVSP measures 33 mmHg.  IVC is dilated with a respiratory response of >50%.  Compared to previous study  10/16/2017 no significant change.   Exercise tetrofosmin stress test  02/01/2020: Normal ECG stress. The patient exercised for 4 minutes and 30 seconds of a Bruce protocol, achieving approximately 6.42 METs.  Normal BP response.  Low level exercise tolerance. PVC and V-Bigeminy and rare PVC in recovery.  Myocardial perfusion is normal. Overall LV systolic function is normal without regional wall motion abnormalities. Stress LV EF: 52%.  No significant change from 10/07/2017. No change in exercise tolerance. Low risk.   Zio Patch Extended out patient EKG monitoring 2 days 05/04/2020:  There were no patient activated events.  Minimum heart rate 57 and maximum heart rate 107 bpm, predominantly sinus rhythm. 2 brief atrial tachycardia has occurred.  Longest 8 beats.  Rare PVCs and PACs.  There was no heart block, no atrial fibrillation. Patient triggered events (2) revealed normal sinus rhythm and rare PACs.  Device Clinic: [employer]  dual-chamber pacemaker 06/15/2020    Scheduled Remote pacemaker check  09/13/2020 AP 8.1%, VP paced 1%, longevity >8 years.  Normal pacemaker function.   EKG  EKG 05/04/2020: Sinus rhythm with first-degree AV block, borderline at rate of 68 bpm, left atrial enlargement, left axis deviation, left anterior fascicular block.  Incomplete right bundle branch block.  IVCD, borderline criteria for LVH.  Poor R wave progression, probably normal variant.  No evidence of ischemia.  Normal QT interval.    EKG  10/15/2019: NSR, LAE, LAD, LAFB. IRBBB. Normal QT. No ischemia.  No significant change from EKG 03/10/2019.  Assessment   No diagnosis found.  No orders of the defined types were placed in this encounter.  There are no discontinued medications.  No orders of the defined types were placed in this encounter.   Recommendations:   Kevin Levine  is a 87 y.o. Caucasian male with CAD status post balloon angioplasty of left circumflex and  stenting to distal left main and the proximal LAD in September 2016, hypertension, hyperlipidemia, stable stage 3 chronic kidney disease, and gout. 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   He underwent outpatient extended EKG monitoring for 48 hours on 05/04/2020, due to syncope, found to have Mobitz 2 AV block, hence underwent dual-chamber pacemaker implantation with [facility] on 06/15/2020.  No further episodes of syncope or dizziness.  This is a 46-monthoffice visit.  He remains asymptomatic, he has not had any angina, he has not had any dizziness or syncope since pacemaker implantation.  His  transmission is scheduled in 2 days.  Blood pressure is well controlled.  Renal function has remained stable, lipids are normal and external labs reviewed.  No clinical evidence heart failure.  I will see him back in 6 months for follow-up.   Adrian Prows, MD, [facility] 10/24/2020, 7:38 AM Office: [phone]

## 2020-11-03 DIAGNOSIS — R0982 Postnasal drip: Secondary | ICD-10-CM | POA: Diagnosis not present

## 2020-11-03 DIAGNOSIS — N183 Chronic kidney disease, stage 3 unspecified: Secondary | ICD-10-CM | POA: Diagnosis not present

## 2020-11-03 DIAGNOSIS — I10 Essential (primary) hypertension: Secondary | ICD-10-CM | POA: Diagnosis not present

## 2020-11-03 DIAGNOSIS — E7800 Pure hypercholesterolemia, unspecified: Secondary | ICD-10-CM | POA: Diagnosis not present

## 2020-11-03 DIAGNOSIS — M79671 Pain in right foot: Secondary | ICD-10-CM | POA: Diagnosis not present

## 2020-11-03 DIAGNOSIS — M109 Gout, unspecified: Secondary | ICD-10-CM | POA: Diagnosis not present

## 2020-11-03 DIAGNOSIS — G4700 Insomnia, unspecified: Secondary | ICD-10-CM | POA: Diagnosis not present

## 2020-11-03 DIAGNOSIS — R7303 Prediabetes: Secondary | ICD-10-CM | POA: Diagnosis not present

## 2020-11-03 DIAGNOSIS — Z950 Presence of cardiac pacemaker: Secondary | ICD-10-CM | POA: Diagnosis not present

## 2020-11-10 ENCOUNTER — Ambulatory Visit: Payer: Medicare Other | Admitting: Podiatry

## 2020-11-24 ENCOUNTER — Ambulatory Visit (INDEPENDENT_AMBULATORY_CARE_PROVIDER_SITE_OTHER): Payer: Medicare Other | Admitting: Podiatry

## 2020-11-24 ENCOUNTER — Other Ambulatory Visit: Payer: Self-pay

## 2020-11-24 DIAGNOSIS — M7751 Other enthesopathy of right foot: Secondary | ICD-10-CM | POA: Diagnosis not present

## 2020-11-24 DIAGNOSIS — I25119 Atherosclerotic heart disease of native coronary artery with unspecified angina pectoris: Secondary | ICD-10-CM

## 2020-11-24 DIAGNOSIS — M7741 Metatarsalgia, right foot: Secondary | ICD-10-CM | POA: Diagnosis not present

## 2020-11-24 DIAGNOSIS — M21371 Foot drop, right foot: Secondary | ICD-10-CM

## 2020-11-30 NOTE — Progress Notes (Signed)
Subjective:   Patient ID: Kevin Levine, male   DOB: 87 y.o.   MRN: [id number]   HPI 86 year old male presents the office today for concerns of discomfort in his right foot.  He gets pain between toes and points on the bottom of the foot gets discomfort.  He has had a cushion in the metatarsal area which helps with a sharp pain.  He is requesting other options to help wear certain shoes.  Certain shoes cause more discomfort than others.  No recent injury.  Numbness or tingling.  No rating pain.   Review of Systems  All other systems reviewed and are negative.  Past Medical History:  Diagnosis Date  . Arthritis   . Chicken pox   . Colon polyps   . Coronary artery disease   . ED (erectile dysfunction)   . Encounter for care of pacemaker 06/22/2020  . Essential hypertension   . Gout   . Heart disease   . Hyperlipidemia   . Kidney disease   . Pacemaker: [place] [employer]  dual-chamber pacemaker 06/15/2020 06/22/2020  . Seasonal allergies   . Second degree Mobitz II AV block 06/22/2020    Past Surgical History:  Procedure Laterality Date  . APPENDECTOMY    . CATARACT EXTRACTION Bilateral   . CORONARY ANGIOPLASTY WITH STENT PLACEMENT  2016  . LEAD REVISION/REPAIR N/A 06/20/2020   Procedure: LEAD REVISION/REPAIR;  Surgeon: Evans Lance, MD;  Location: [place] [facility];  Service: Cardiovascular;  Laterality: N/A;  . PACEMAKER IMPLANT N/A 06/15/2020   Procedure: PACEMAKER IMPLANT;  Surgeon: Thompson Grayer, MD;  Location: [place] [facility];  Service: Cardiovascular;  Laterality: N/A;  . Status post ballon angioplasty of the left circumflex and drug eluting stent to the distal left main into the proximal LAD   03/14/2015     Current Outpatient Medications:  .  allopurinol (ZYLOPRIM) 100 MG tablet, Take 1 tablet (100 mg total) by mouth daily., Disp: 90 tablet, Rfl: 3 .  amLODipine (NORVASC) 5 MG tablet, Take 5 mg by mouth daily., Disp: , Rfl:  .  aspirin EC 81 MG  tablet, Take 81 mg by mouth daily., Disp: , Rfl:  .  atorvastatin (LIPITOR) 80 MG tablet, Take 1 tablet (80 mg total) by mouth daily. (Patient taking differently: Take 80 mg by mouth at bedtime.), Disp: 90 tablet, Rfl: 3 .  diphenhydramine-acetaminophen (TYLENOL PM EXTRA STRENGTH) 25-500 MG TABS tablet, Take 1 tablet by mouth at bedtime as needed for sleep., Disp: , Rfl:  .  doxazosin (CARDURA) 2 MG tablet, TAKE 1 TABLET DAILY (Patient taking differently: Take 2 mg by mouth at bedtime.), Disp: 90 tablet, Rfl: 3 .  ezetimibe (ZETIA) 10 MG tablet, Take 10 mg by mouth at bedtime., Disp: , Rfl: 1 .  loratadine (CLARITIN) 10 MG tablet, Take 10 mg by mouth daily as needed for allergies. , Disp: , Rfl:  .  losartan (COZAAR) 100 MG tablet, Take 1 tablet (100 mg total) by mouth daily., Disp: 90 tablet, Rfl: 1 .  metoprolol succinate (TOPROL-XL) 25 MG 24 hr tablet, Take 25 mg by mouth daily., Disp: , Rfl:  .  Multiple Vitamins-Minerals (MULTIVITAMIN WITH MINERALS) tablet, Take 1 tablet by mouth daily., Disp: , Rfl:  .  nitroGLYCERIN (NITROSTAT) 0.4 MG SL tablet, Place 0.4 mg under the tongue every 5 (five) minutes as needed for chest pain., Disp: , Rfl: 4 .  zolpidem (AMBIEN) 5 MG tablet, Take 5 mg by mouth  at bedtime., Disp: , Rfl:   No Known Allergies      Objective:  Physical Exam  General: AAO x3, NAD  Dermatological: Skin is warm, dry and supple bilateral. There are no open sores, no preulcerative lesions, no rash or signs of infection present.  Vascular: Dorsalis Pedis artery and Posterior Tibial artery pedal pulses are 2/4 bilateral with immedate capillary fill time. There is no pain with calf compression, swelling, warmth, erythema.   Neruologic: Grossly intact via light touch bilateral.  Sensation intact with Semmes Weinstein monofilament.  Musculoskeletal: There is no significant tenderness today along the MPJs plantarly but no specific area of pinpoint tenderness.  Symptoms seem to worsen  with wearing certain shoes.  Brought in a pair shoes that are stiffer insole which should not cause any discomfort and the new shoes helped some but still having discomfort at the area.  No edema, erythema.  Muscular strength 5/5 in all groups tested bilateral.  Gait: Unassisted, Nonantalgic.       Assessment:   Metatarsalgia, capsulitis     Plan:  -Treatment options discussed including all alternatives, risks, and complications -Etiology of symptoms were discussed -Upon evaluation but she has issues of more comfortable rest of her muscle.  Discussed shoe modifications to help take the pressure off of it in combination with a metatarsal offloading pad.    Trula Slade DPM

## 2020-12-20 DIAGNOSIS — Z45018 Encounter for adjustment and management of other part of cardiac pacemaker: Secondary | ICD-10-CM | POA: Diagnosis not present

## 2020-12-20 DIAGNOSIS — Z950 Presence of cardiac pacemaker: Secondary | ICD-10-CM | POA: Diagnosis not present

## 2021-01-11 DIAGNOSIS — M79671 Pain in right foot: Secondary | ICD-10-CM | POA: Diagnosis not present

## 2021-01-11 DIAGNOSIS — R413 Other amnesia: Secondary | ICD-10-CM | POA: Diagnosis not present

## 2021-01-18 ENCOUNTER — Telehealth: Payer: Self-pay | Admitting: Internal Medicine

## 2021-01-18 ENCOUNTER — Telehealth: Payer: Self-pay

## 2021-01-18 NOTE — Telephone Encounter (Signed)
Follow Up:      I did not need this encounter 

## 2021-01-18 NOTE — Telephone Encounter (Signed)
Pt called and stated that he has not heard anything back regarding his pacemaker in a long time. He wants to make sure everything looks okay. Please advise.

## 2021-01-18 NOTE — Telephone Encounter (Signed)
I sent my chart message:  Your device is working fine. Your next transmission is on Sept  21st. Your device is being monitored regularly on a daily basis for alerts. So far, all good. JG

## 2021-01-19 NOTE — Telephone Encounter (Signed)
Called and spoke to pt, pt is aware.

## 2021-01-26 DIAGNOSIS — L84 Corns and callosities: Secondary | ICD-10-CM | POA: Diagnosis not present

## 2021-01-26 DIAGNOSIS — M7750 Other enthesopathy of unspecified foot: Secondary | ICD-10-CM | POA: Diagnosis not present

## 2021-01-31 ENCOUNTER — Telehealth: Payer: TRICARE For Life (TFL) | Admitting: *Deleted

## 2021-02-20 ENCOUNTER — Encounter: Payer: Self-pay | Admitting: Cardiology

## 2021-02-20 ENCOUNTER — Telehealth: Payer: Self-pay

## 2021-02-20 DIAGNOSIS — M79671 Pain in right foot: Secondary | ICD-10-CM | POA: Diagnosis not present

## 2021-02-20 DIAGNOSIS — L84 Corns and callosities: Secondary | ICD-10-CM | POA: Diagnosis not present

## 2021-02-20 NOTE — Telephone Encounter (Signed)
Patient called and stated that he needs to have an MRI done for his foot. A provider at [facility] in [place] stated that he would need a letter saying it is safe for him to get the MRI with his pacemaker. Please advise. Fax number is [phone].

## 2021-02-20 NOTE — Telephone Encounter (Signed)
error 

## 2021-02-21 ENCOUNTER — Other Ambulatory Visit: Payer: Self-pay | Admitting: Student

## 2021-02-21 ENCOUNTER — Other Ambulatory Visit (HOSPITAL_COMMUNITY): Payer: Self-pay | Admitting: Student

## 2021-02-21 DIAGNOSIS — M79671 Pain in right foot: Secondary | ICD-10-CM

## 2021-02-22 DIAGNOSIS — Z950 Presence of cardiac pacemaker: Secondary | ICD-10-CM | POA: Diagnosis not present

## 2021-02-22 DIAGNOSIS — Z86010 Personal history of colonic polyps: Secondary | ICD-10-CM | POA: Diagnosis not present

## 2021-02-23 ENCOUNTER — Other Ambulatory Visit (HOSPITAL_COMMUNITY): Payer: Self-pay | Admitting: Student

## 2021-02-23 DIAGNOSIS — M79671 Pain in right foot: Secondary | ICD-10-CM

## 2021-03-01 ENCOUNTER — Ambulatory Visit: Payer: TRICARE For Life (TFL) | Admitting: Cardiology

## 2021-03-07 ENCOUNTER — Ambulatory Visit: Payer: Medicare Other | Admitting: Cardiology

## 2021-03-07 ENCOUNTER — Encounter: Payer: Self-pay | Admitting: Cardiology

## 2021-03-07 ENCOUNTER — Other Ambulatory Visit: Payer: Self-pay

## 2021-03-07 VITALS — BP 126/72 | HR 87 | Temp 98.1°F | Resp 17 | Ht 71.0 in | Wt 207.4 lb

## 2021-03-07 DIAGNOSIS — E7800 Pure hypercholesterolemia, unspecified: Secondary | ICD-10-CM | POA: Diagnosis not present

## 2021-03-07 DIAGNOSIS — Z950 Presence of cardiac pacemaker: Secondary | ICD-10-CM

## 2021-03-07 DIAGNOSIS — I25119 Atherosclerotic heart disease of native coronary artery with unspecified angina pectoris: Secondary | ICD-10-CM

## 2021-03-07 DIAGNOSIS — I10 Essential (primary) hypertension: Secondary | ICD-10-CM | POA: Diagnosis not present

## 2021-03-07 NOTE — Progress Notes (Signed)
Primary Physician/Referring:  [name], MD  Patient ID: Kevin Levine, male    DOB: [birth year], 87 y.o.   MRN: [id number]  Chief Complaint  Patient presents with   Coronary Artery Disease    1 YEAR    Hypertension   Hyperlipidemia   HPI:    Kevin Levine  is a 87 y.o. Caucasian male with CAD status post balloon angioplasty of left circumflex and stenting to distal left main and the proximal LAD in September 2016, hypertension, hyperlipidemia, stable stage 3 chronic kidney disease, and gout. He has chronic bilateral leg edema due to varicose veins, has been wearing support stockings. He has mild abdominal aortic ectasia by duplex.   He underwent outpatient extended EKG monitoring for 48 hours on 05/04/2020, due to syncope, found to have Mobitz 2 AV block, hence underwent dual-chamber pacemaker implantation with [facility] on 06/15/2020.  No further episodes of syncope or dizziness.    Patient presents for annual visit.  His only concern today is fatigue over the last few months.  He states he is under significant stress with family as well as work and has not been sleeping well due to cold temperatures in his house at night.  Patient is scheduled to have thermostat fixed in his home and is confident this will improve his fatigue so that he can sleep better.  Otherwise patient is asymptomatic.  Denies chest pain, palpitations, syncope, near syncope, dizziness, orthopnea, PND, leg swelling.  Past Medical History:  Diagnosis Date   Arthritis    Chicken pox    Colon polyps    Coronary artery disease    ED (erectile dysfunction)    Encounter for care of pacemaker 06/22/2020   Essential hypertension    Gout    Heart disease    Hyperlipidemia    Kidney disease    Pacemaker: [employer] MRI  dual-chamber pacemaker 06/15/2020 06/22/2020   Seasonal allergies    Second degree Mobitz II AV block 06/22/2020   Past Surgical History:  Procedure Laterality Date    APPENDECTOMY     CATARACT EXTRACTION Bilateral    CORONARY ANGIOPLASTY WITH STENT PLACEMENT  2016   LEAD REVISION/REPAIR N/A 06/20/2020   Procedure: LEAD REVISION/REPAIR;  Surgeon: Evans Lance, MD;  Location: [facility];  Service: Cardiovascular;  Laterality: N/A;   PACEMAKER IMPLANT N/A 06/15/2020   Procedure: PACEMAKER IMPLANT;  Surgeon: Thompson Grayer, MD;  Location: [place] [facility];  Service: Cardiovascular;  Laterality: N/A;   Status post ballon angioplasty of the left circumflex and drug eluting stent to the distal left main into the proximal LAD   03/14/2015   Family History  Problem Relation Age of Onset   Lung cancer Mother    Aneurysm Brother    Cancer Paternal Grandfather    Heart attack Maternal Grandfather    Hypertension Father    Social History   Tobacco Use   Smoking status: Never   Smokeless tobacco: Never  Substance Use Topics   Alcohol use: Yes    Alcohol/week: 21.0 standard drinks    Types: 14 Glasses of wine, 7 Standard drinks or equivalent per week    Comment: OCCASIONALLY   Marital Status: Married  ROS  Review of Systems  Constitutional: Positive for malaise/fatigue (not sleeping well).  Cardiovascular:  Positive for dyspnea on exertion (stable and minimal). Negative for chest pain, leg swelling, orthopnea, palpitations, paroxysmal nocturnal dyspnea and syncope.  Gastrointestinal:  Negative for melena.  Neurological:  Negative for dizziness.  Objective  Blood pressure 126/72, pulse 87, temperature 98.1 F (36.7 C), temperature source Temporal, resp. rate 17, height '5\' 11"'  (1.803 m), weight 207 lb 6.4 oz (94.1 kg), SpO2 97 %.  Vitals with BMI 03/07/2021 09/21/2020 09/12/2020  Height '5\' 11"'  - '5\' 11"'   Weight 207 lbs 6 oz 217 lbs 10 oz 217 lbs  BMI 28.94 38.32 91.91  Systolic 660 600 459  Diastolic 72 68 78  Pulse 87 62 67     Physical Exam Vitals reviewed.  Constitutional:      General: He is not in acute distress.    Appearance: He is  well-developed.  HENT:     Head: Normocephalic and atraumatic.  Neck:     Vascular: No JVD.  Cardiovascular:     Rate and Rhythm: Normal rate and regular rhythm.     Pulses: Intact distal pulses.     Heart sounds: Normal heart sounds, S1 normal and S2 normal. No murmur heard.   No gallop.     Comments: No JVD. Pulmonary:     Effort: Pulmonary effort is normal. No respiratory distress.     Breath sounds: Normal breath sounds. No wheezing, rhonchi or rales.  Chest:     Comments: Pacemaker/ICD site noted  in the left infraclavicular fossa.   Musculoskeletal:     Right lower leg: Edema (trace) present.     Left lower leg: Edema (trace) present.  Neurological:     Mental Status: He is alert.   Laboratory examination:   External labs :  05/03/2020:  Serum glucose 134 mg, BUN 26, creatinine 1.43, EGFR 47 mL, potassium 4.6.  Hb 12.4/HCT 37.0, platelets 243.  A1c 6.3%.  Labs 04/06/2020:  Total cholesterol 117, triglycerides 114, HDL 47, LDL 50.  Cholesterol, total 113.000 07/24/2019 HDL 49.000 07/24/2019 LDL-C 31.000 07/24/2019 Triglycerides 167.000 07/24/2019   A1C 6.200 07/24/2019 TSH 2.750 12/24/2019   Hemoglobin 12.400 12/24/2019  Creatinine, Serum 1.440 12/24/2019 Potassium 4.300 12/24/2019 Magnesium N/D ALT (SGPT) 18.000 03/30/2019   12/28/2017: Total cholesterol 113, triglycerides 77, HDL 47, LDL 51 mg. Serum glucose 112 mg, BUN 22, creatinine 1.64, eGFR 38/44 mL, potassium 4.4. CMP otherwise normal.  05/23/2017: Cholesterol 180, triglycerides 152, HDL 44, LDL 105. TSH 2.6. CBC normal. Creatinine 1.48, potassium 4.3, EGFR of 48, BMP otherwise normal. Hepatic function panel normal. Allergies  No Known Allergies    Medications Prior to Visit:   Outpatient Medications Prior to Visit  Medication Sig Dispense Refill   allopurinol (ZYLOPRIM) 100 MG tablet Take 1 tablet (100 mg total) by mouth daily. 90 tablet 3   aspirin EC 81 MG tablet Take 81 mg by mouth daily.      atorvastatin (LIPITOR) 80 MG tablet Take 1 tablet (80 mg total) by mouth daily. (Patient taking differently: Take 80 mg by mouth at bedtime.) 90 tablet 3   cephALEXin (KEFLEX) 250 MG capsule Take 250 mg by mouth 4 (four) times daily.     diphenhydramine-acetaminophen (TYLENOL PM EXTRA STRENGTH) 25-500 MG TABS tablet Take 1 tablet by mouth at bedtime as needed for sleep.     donepezil (ARICEPT) 5 MG tablet Take 5 mg by mouth daily.     doxazosin (CARDURA) 4 MG tablet Take 4 mg by mouth daily.     ezetimibe (ZETIA) 10 MG tablet Take 10 mg by mouth at bedtime.  1   ipratropium (ATROVENT) 0.06 % nasal spray Place 2 sprays into both nostrils as needed.  loratadine (CLARITIN) 10 MG tablet Take 10 mg by mouth daily as needed for allergies.      losartan (COZAAR) 100 MG tablet Take 1 tablet (100 mg total) by mouth daily. 90 tablet 1   metoprolol succinate (TOPROL-XL) 25 MG 24 hr tablet Take 25 mg by mouth daily.     Multiple Vitamins-Minerals (MULTIVITAMIN WITH MINERALS) tablet Take 1 tablet by mouth daily.     nitroGLYCERIN (NITROSTAT) 0.4 MG SL tablet Place 0.4 mg under the tongue every 5 (five) minutes as needed for chest pain.  4   zolpidem (AMBIEN) 5 MG tablet Take 5 mg by mouth at bedtime.     amLODipine (NORVASC) 5 MG tablet Take 5 mg by mouth daily.     doxazosin (CARDURA) 2 MG tablet TAKE 1 TABLET DAILY (Patient taking differently: Take 4 mg by mouth at bedtime.) 90 tablet 3   No facility-administered medications prior to visit.     Final Medications at End of Visit    Current Meds  Medication Sig   allopurinol (ZYLOPRIM) 100 MG tablet Take 1 tablet (100 mg total) by mouth daily.   aspirin EC 81 MG tablet Take 81 mg by mouth daily.   atorvastatin (LIPITOR) 80 MG tablet Take 1 tablet (80 mg total) by mouth daily. (Patient taking differently: Take 80 mg by mouth at bedtime.)   cephALEXin (KEFLEX) 250 MG capsule Take 250 mg by mouth 4 (four) times daily.   diphenhydramine-acetaminophen  (TYLENOL PM EXTRA STRENGTH) 25-500 MG TABS tablet Take 1 tablet by mouth at bedtime as needed for sleep.   donepezil (ARICEPT) 5 MG tablet Take 5 mg by mouth daily.   doxazosin (CARDURA) 4 MG tablet Take 4 mg by mouth daily.   ezetimibe (ZETIA) 10 MG tablet Take 10 mg by mouth at bedtime.   ipratropium (ATROVENT) 0.06 % nasal spray Place 2 sprays into both nostrils as needed.   loratadine (CLARITIN) 10 MG tablet Take 10 mg by mouth daily as needed for allergies.    losartan (COZAAR) 100 MG tablet Take 1 tablet (100 mg total) by mouth daily.   metoprolol succinate (TOPROL-XL) 25 MG 24 hr tablet Take 25 mg by mouth daily.   Multiple Vitamins-Minerals (MULTIVITAMIN WITH MINERALS) tablet Take 1 tablet by mouth daily.   nitroGLYCERIN (NITROSTAT) 0.4 MG SL tablet Place 0.4 mg under the tongue every 5 (five) minutes as needed for chest pain.   zolpidem (AMBIEN) 5 MG tablet Take 5 mg by mouth at bedtime.    Radiology:  No results found.  Cardiac Studies:   Coronary Angiography 11/01/2014: S/P resolute DES 3.5 x 18 mm stent into the distal left main into the LAD and balloon angioplasty to circumflex arteon   Echocardiogram 10/16/2017: Left ventricle cavity is normal in size. Mild concentric hypertrophy of the left ventricle. Normal global wall motion. Normal diastolic filling pattern. Calculated EF 55%. Mild tricuspid regurgitation. No evidence of pulmonary hypertension.  Abdominal aortic duplex 10/16/2017: Mild diffuse calcific plaque noted in abdominal aorta. An abdominal aortic aneurysm measuring 2.85 x 2.82 x 2.73 cm is seen in mid aorta. There is mild ectasia of the abdominal aorta. The bilateral iliac arteries are also dilated at 15 mm. Normal velocity. Recheck in 2 years.  Pulse oximetry, overnight 03/12/2019: Study suggest very mild nocturnal hypoxemia.  ABI 11/17/2019: Right: Resting right ankle-brachial index is within normal range. No  evidence of significant right lower extremity  arterial disease. The right  toe-brachial index is normal. RT great toe  pressure = 151 mmHg.  Left: Resting left ankle-brachial index indicates noncompressible left  lower extremity arteries. The left toe-brachial index is normal. LT Great  toe pressure = 179 mmHg.   Echocardiogram 01/22/2020:  Normal LV systolic function with visual EF 50-55%. Left ventricle cavity is normal in size. Mild left ventricular hypertrophy. Normal global wall motion. Indeterminate diastolic filling pattern, normal LAP. Calculated EF 47%.  Left atrial cavity is moderately dilated.  Mild to moderate tricuspid regurgitation. No evidence of pulmonary hypertension. RVSP measures 33 mmHg.  IVC is dilated with a respiratory response of >50%.  Compared to previous study 10/16/2017 no significant change.   Exercise tetrofosmin stress test  02/01/2020: Normal ECG stress. The patient exercised for 4 minutes and 30 seconds of a Bruce protocol, achieving approximately 6.42 METs.  Normal BP response.  Low level exercise tolerance. PVC and V-Bigeminy and rare PVC in recovery.  Myocardial perfusion is normal. Overall LV systolic function is normal without regional wall motion abnormalities. Stress LV EF: 52%.  No significant change from 10/07/2017. No change in exercise tolerance. Low risk.   Zio Patch Extended out patient EKG monitoring 2 days 05/04/2020:  There were no patient activated events.  Minimum heart rate 57 and maximum heart rate 107 bpm, predominantly sinus rhythm. 2 brief atrial tachycardia has occurred.  Longest 8 beats.  Rare PVCs and PACs.  There was no heart block, no atrial fibrillation. Patient triggered events (2) revealed normal sinus rhythm and rare PACs.  Device Clinic: [facility] Jude Medical Assurity MRI  dual-chamber pacemaker 06/15/2020   Remote dual-chamber pacemaker transmission 12/16/2020: Longevity 9.7-11.1 years. AP 18%, VP <1%.  There were no mode switches, no high ventricular rate episodes.  Normal  pacemaker function.  EKG   EKG 03/07/2021: Sinus rhythm with first-degree AV block at rate of 76 bpm, left atrial enlargement, left axis deviation, left anterior fascicular block.  Incomplete right bundle branch block.  No evidence of ischemia.  No significant change from 05/04/2020.    Assessment     ICD-10-CM   1. Essential hypertension  I10 EKG 12-Lead    2. Atherosclerosis of native coronary artery of native heart with angina pectoris ([place])  I25.119     3. Pacemaker: [place] MRI  dual-chamber pacemaker 06/15/2020  Z95.0     4. Pure hypercholesterolemia  E78.00       No orders of the defined types were placed in this encounter.  Medications Discontinued During This Encounter  Medication Reason   amLODipine (NORVASC) 5 MG tablet Error   doxazosin (CARDURA) 2 MG tablet Error    Orders Placed This Encounter  Procedures   EKG 12-Lead     Recommendations:   Kevin Levine  is a 87 y.o. Caucasian male with CAD status post balloon angioplasty of left circumflex and stenting to distal left main and the proximal LAD in September 2016, hypertension, hyperlipidemia, stable stage 3 chronic kidney disease, and gout. 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   He underwent outpatient extended EKG monitoring for 48 hours on 05/04/2020, due to syncope, found to have Mobitz 2 AV block, hence underwent dual-chamber pacemaker implantation with [facility] on 06/15/2020.  No further episodes of syncope or dizziness.   Patient presents for annual visit.  Suspect his fatigue is related to poor sleep and high levels of stress.  Advised patient to notify our office if this fails to improve with improved sleeping habits.  Otherwise patient is stable  from a cardiovascular standpoint.  Blood pressure is well controlled and beds are well controlled.  There is no clinical evidence of heart failure at this time.  Viewed and discussed with patient recent pacemaker  transmission, normal pacemaker function.  Follow-up in 1 year, sooner if needed, for CAD, hypertension, hyperlipidemia, pacemaker.   Alethia Berthold, PA-C 03/07/2021, 4:28 PM Office: [phone]

## 2021-03-10 DIAGNOSIS — R251 Tremor, unspecified: Secondary | ICD-10-CM | POA: Diagnosis not present

## 2021-03-20 ENCOUNTER — Encounter: Payer: Self-pay | Admitting: Neurology

## 2021-03-20 ENCOUNTER — Ambulatory Visit: Payer: Medicare Other | Admitting: Neurology

## 2021-03-20 DIAGNOSIS — Z950 Presence of cardiac pacemaker: Secondary | ICD-10-CM | POA: Diagnosis not present

## 2021-03-20 DIAGNOSIS — Z45018 Encounter for adjustment and management of other part of cardiac pacemaker: Secondary | ICD-10-CM | POA: Diagnosis not present

## 2021-04-03 ENCOUNTER — Other Ambulatory Visit: Payer: Self-pay

## 2021-04-03 ENCOUNTER — Ambulatory Visit (HOSPITAL_COMMUNITY)
Admission: RE | Admit: 2021-04-03 | Discharge: 2021-04-03 | Disposition: A | Discharge status: Home / Self Care | Payer: Medicare Other | Source: Ambulatory Visit | Attending: Student | Admitting: Student

## 2021-04-03 DIAGNOSIS — M79671 Pain in right foot: Secondary | ICD-10-CM

## 2021-04-03 DIAGNOSIS — M19071 Primary osteoarthritis, right ankle and foot: Secondary | ICD-10-CM | POA: Diagnosis not present

## 2021-04-03 DIAGNOSIS — R600 Localized edema: Secondary | ICD-10-CM | POA: Diagnosis not present

## 2021-04-03 IMAGING — MR MR FOOT*R* W/O CM
4 of 6 series · 19 of 40 positions shown · non-contrast
Comparison: Radiograph [DATE]

CLINICAL DATA: Pain

EXAM:
MRI OF THE RIGHT FOREFOOT WITHOUT CONTRAST
TECHNIQUE: Multiplanar, multisequence MR imaging of the right forefoot was
performed. No intravenous contrast was administered.

[Series 4: T1 · coronal · 3.0mm · 0.31mm/px · 3 of 49 slices shown (1 of 2)]
[im 6/49]
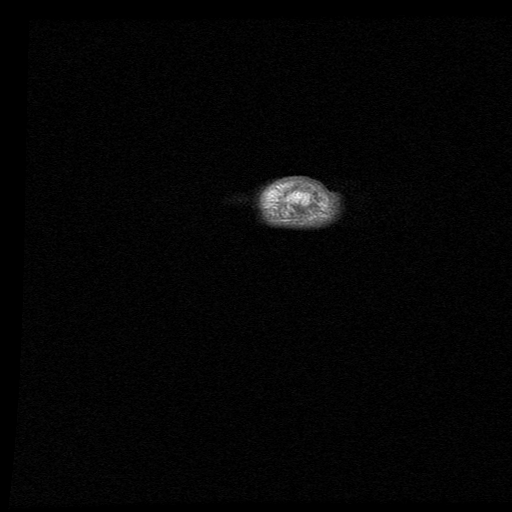
[im 27/49]
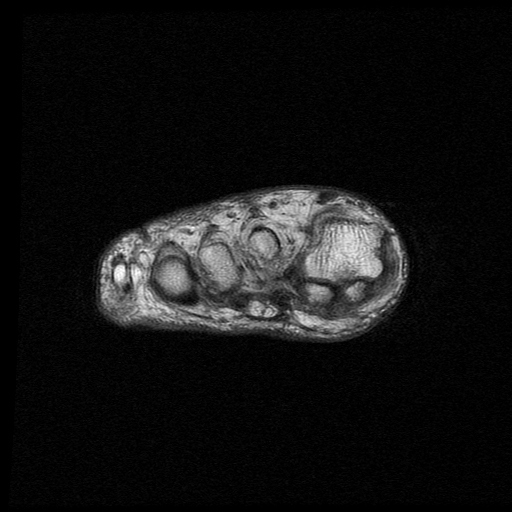
[im 43/49]
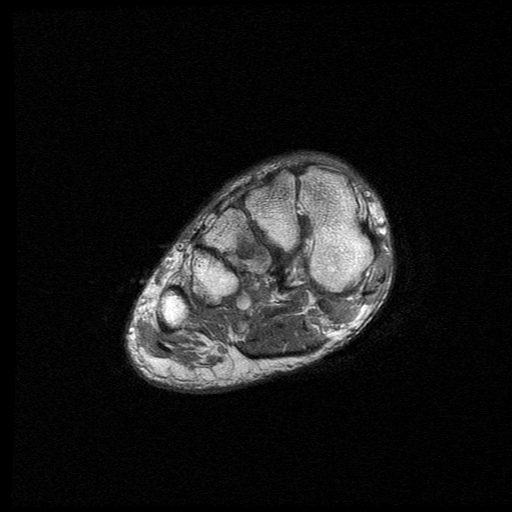

[Series 5: T2 fat-sat · coronal · 3.0mm · 0.31mm/px · 10 of 49 slices shown (1 of 2)]
[im 1/49]
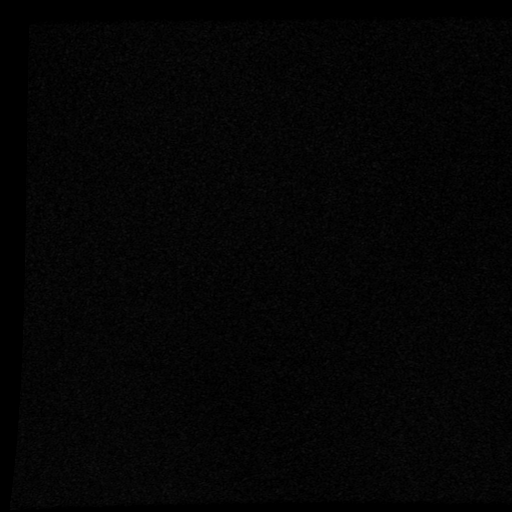
[im 5/49]
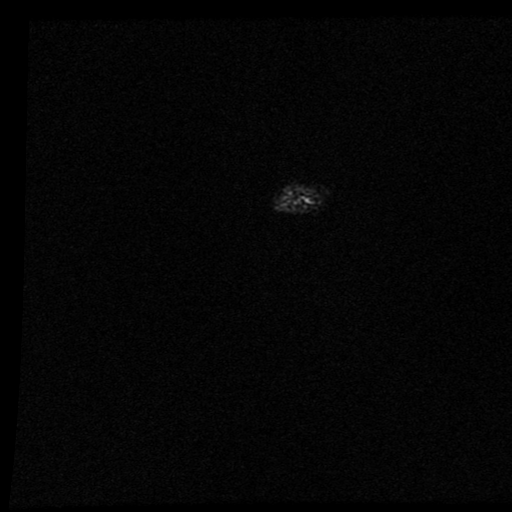
[im 10/49]
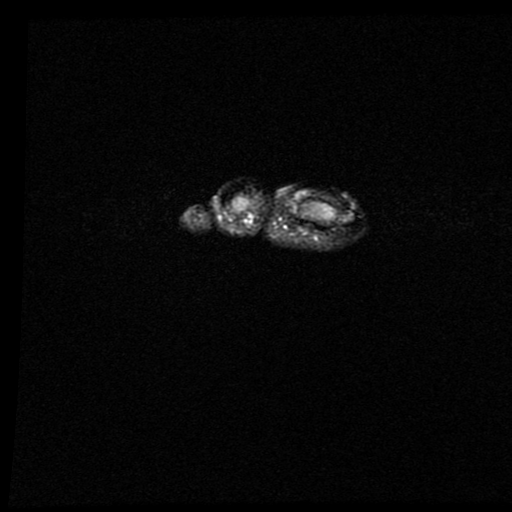
[im 15/49]
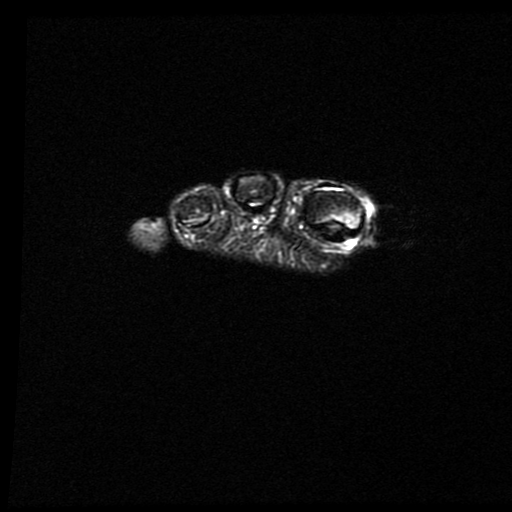
[im 20/49]
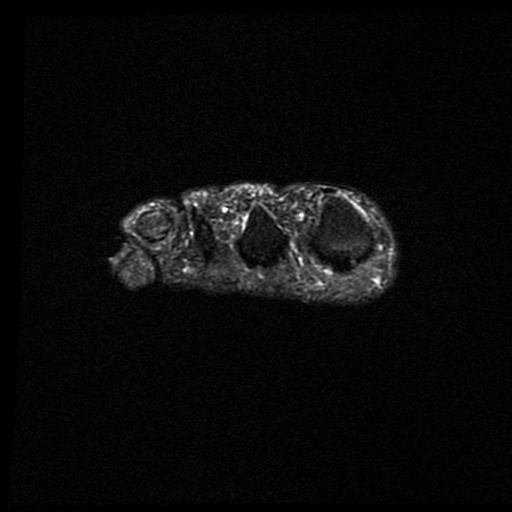
[im 25/49]
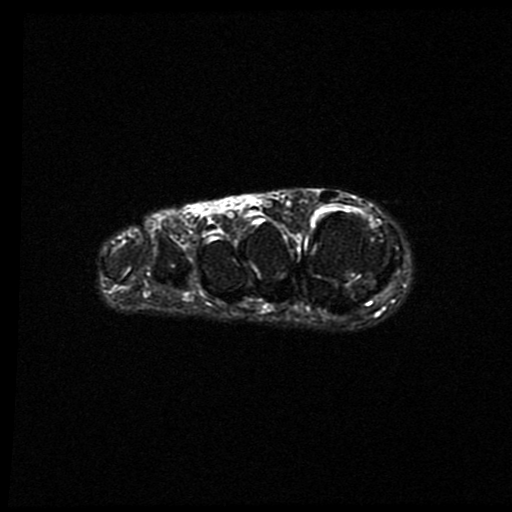
[im 29/49]
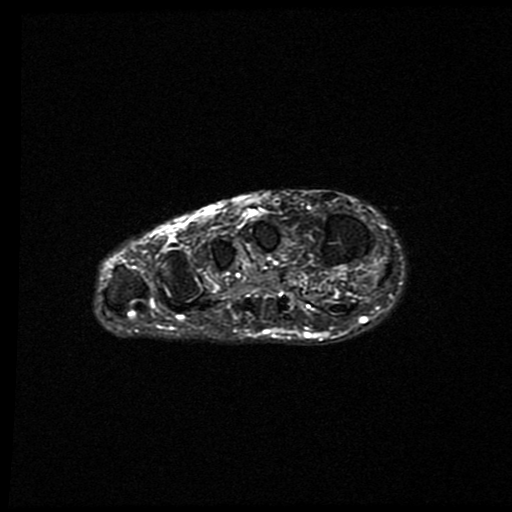
[im 34/49]
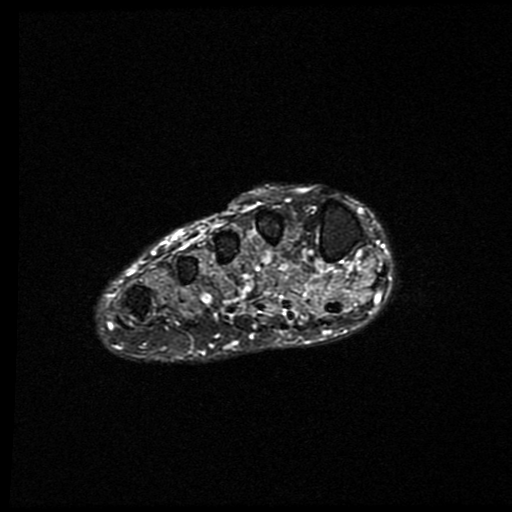
[im 39/49]
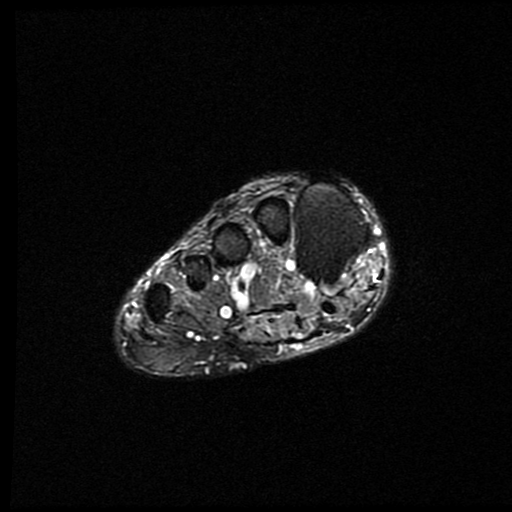
[im 44/49]
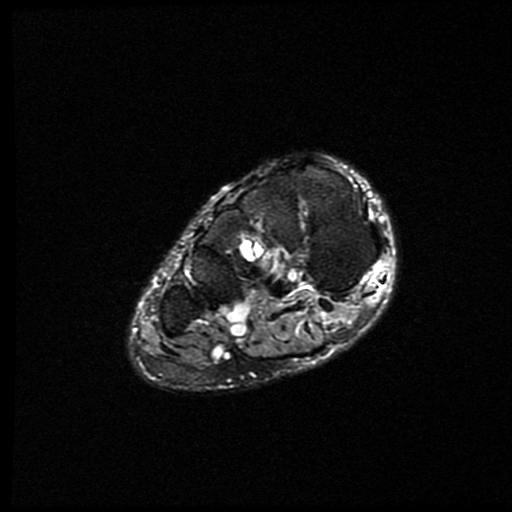

[Series 6: T1 · axial · 3.0mm · 0.29mm/px · z∈[-89,-26]mm · 3 of 18 slices shown (2 of 2)]
[im 1/18]
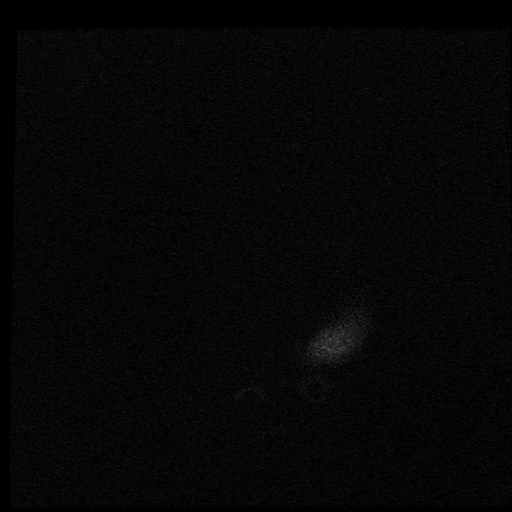
[im 12/18]
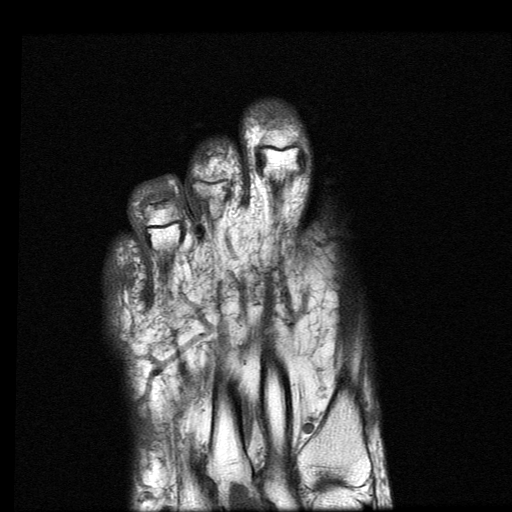
[im 18/18]
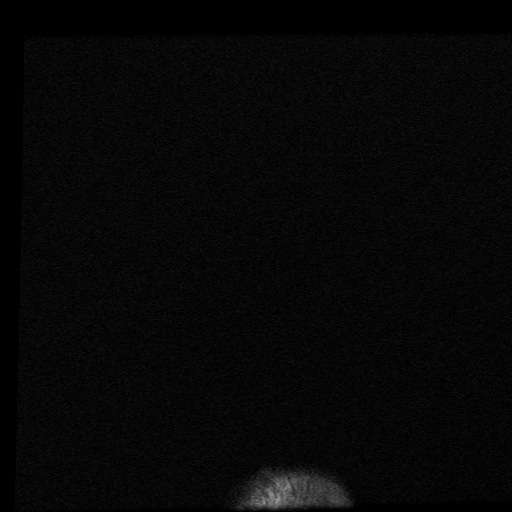

[Series 7: T2 fat-sat · axial · 3.0mm · 0.31mm/px · z∈[-89,-27]mm · 3 of 18 slices shown (2 of 2)]
[im 1/18]
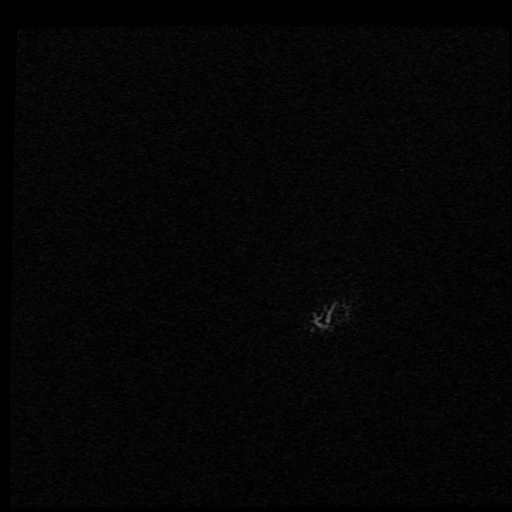
[im 12/18]
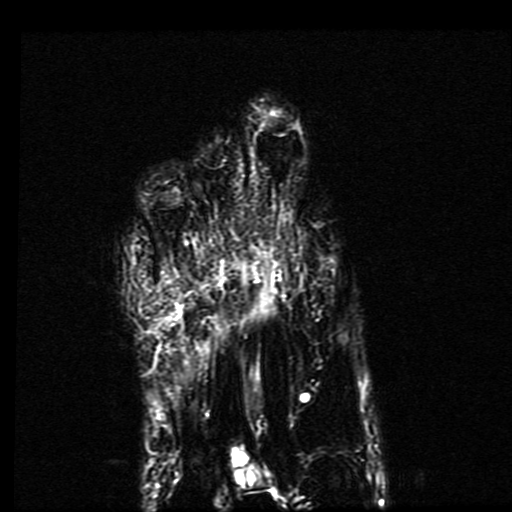
[im 18/18]
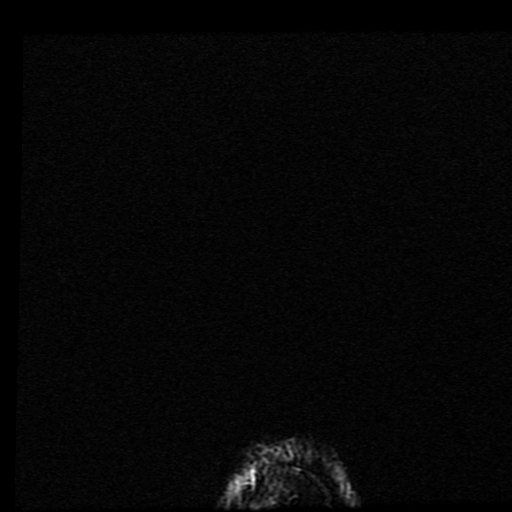

[19 of 40 positions shown; findings below may reference images not displayed]

FINDINGS: Bones/Joint/Cartilage

There is mild bony edema in the distal phalanx of the second and
fourth toes, no associated T1 signal. There is no evidence of acute
fracture. There is third tarsometatarsal joint degenerative change
with subchondral cystic change in the base of the third metatarsal.

Ligaments

Intact Lisfranc ligament.  No evidence of plantar plate tear

Muscles and Tendons

Mild diffuse muscle atrophy of the foot. No significant
intramuscular edema. No intramuscular collection. No acute tendon
tear.

Soft tissues

No cystic or solid mass. No evidence of intermetatarsal neuroma or
bursitis.
IMPRESSION: Mild bony edema signal within the distal phalanx of the second and
fourth toes, which may be artifactual or reactive marrow changes,
correlate with localized pain/tenderness.

Mild third tarsometatarsal joint degenerative arthritis with
subchondral cystic change in the base of the third metatarsal.

Mild diffuse muscle atrophy of the foot.

No evidence of intermetatarsal neuroma or bursitis.

## 2021-04-03 NOTE — Progress Notes (Signed)
Per order, "No programming changes for scan  after completion of exam, please send transmission"   After MRI will send transmission. Nothing further needed at this time.

## 2021-04-07 DIAGNOSIS — L84 Corns and callosities: Secondary | ICD-10-CM | POA: Diagnosis not present

## 2021-04-12 DIAGNOSIS — Z0000 Encounter for general adult medical examination without abnormal findings: Secondary | ICD-10-CM | POA: Diagnosis not present

## 2021-04-12 DIAGNOSIS — I2510 Atherosclerotic heart disease of native coronary artery without angina pectoris: Secondary | ICD-10-CM | POA: Diagnosis not present

## 2021-04-12 DIAGNOSIS — E7800 Pure hypercholesterolemia, unspecified: Secondary | ICD-10-CM | POA: Diagnosis not present

## 2021-04-12 DIAGNOSIS — M109 Gout, unspecified: Secondary | ICD-10-CM | POA: Diagnosis not present

## 2021-04-12 DIAGNOSIS — I10 Essential (primary) hypertension: Secondary | ICD-10-CM | POA: Diagnosis not present

## 2021-04-12 DIAGNOSIS — M1990 Unspecified osteoarthritis, unspecified site: Secondary | ICD-10-CM | POA: Diagnosis not present

## 2021-04-12 DIAGNOSIS — G4700 Insomnia, unspecified: Secondary | ICD-10-CM | POA: Diagnosis not present

## 2021-04-12 DIAGNOSIS — Z1389 Encounter for screening for other disorder: Secondary | ICD-10-CM | POA: Diagnosis not present

## 2021-04-12 DIAGNOSIS — R7303 Prediabetes: Secondary | ICD-10-CM | POA: Diagnosis not present

## 2021-04-12 DIAGNOSIS — N183 Chronic kidney disease, stage 3 unspecified: Secondary | ICD-10-CM | POA: Diagnosis not present

## 2021-04-20 ENCOUNTER — Encounter: Payer: Self-pay | Admitting: *Deleted

## 2021-04-21 ENCOUNTER — Ambulatory Visit: Payer: Medicare Other | Admitting: Neurology

## 2021-04-21 ENCOUNTER — Encounter: Payer: Self-pay | Admitting: Neurology

## 2021-06-01 DIAGNOSIS — F0390 Unspecified dementia without behavioral disturbance: Secondary | ICD-10-CM | POA: Diagnosis not present

## 2021-06-19 DIAGNOSIS — Z45018 Encounter for adjustment and management of other part of cardiac pacemaker: Secondary | ICD-10-CM | POA: Diagnosis not present

## 2021-06-21 ENCOUNTER — Encounter: Payer: Self-pay | Admitting: Cardiology

## 2021-06-29 ENCOUNTER — Telehealth: Payer: Self-pay | Admitting: Neurology

## 2021-06-29 ENCOUNTER — Encounter: Payer: Self-pay | Admitting: Neurology

## 2021-06-29 ENCOUNTER — Ambulatory Visit (INDEPENDENT_AMBULATORY_CARE_PROVIDER_SITE_OTHER): Payer: Medicare Other | Admitting: Neurology

## 2021-06-29 ENCOUNTER — Other Ambulatory Visit: Payer: Self-pay

## 2021-06-29 VITALS — BP 145/69 | HR 82 | Ht 71.0 in | Wt 216.0 lb

## 2021-06-29 DIAGNOSIS — I639 Cerebral infarction, unspecified: Secondary | ICD-10-CM

## 2021-06-29 DIAGNOSIS — Z461 Encounter for fitting and adjustment of hearing aid: Secondary | ICD-10-CM | POA: Insufficient documentation

## 2021-06-29 DIAGNOSIS — N529 Male erectile dysfunction, unspecified: Secondary | ICD-10-CM | POA: Insufficient documentation

## 2021-06-29 DIAGNOSIS — M21371 Foot drop, right foot: Secondary | ICD-10-CM

## 2021-06-29 DIAGNOSIS — R7303 Prediabetes: Secondary | ICD-10-CM | POA: Insufficient documentation

## 2021-06-29 DIAGNOSIS — H9313 Tinnitus, bilateral: Secondary | ICD-10-CM | POA: Insufficient documentation

## 2021-06-29 DIAGNOSIS — R411 Anterograde amnesia: Secondary | ICD-10-CM | POA: Diagnosis not present

## 2021-06-29 DIAGNOSIS — L821 Other seborrheic keratosis: Secondary | ICD-10-CM | POA: Insufficient documentation

## 2021-06-29 DIAGNOSIS — R413 Other amnesia: Secondary | ICD-10-CM | POA: Insufficient documentation

## 2021-06-29 DIAGNOSIS — Z23 Encounter for immunization: Secondary | ICD-10-CM | POA: Insufficient documentation

## 2021-06-29 DIAGNOSIS — M179 Osteoarthritis of knee, unspecified: Secondary | ICD-10-CM | POA: Insufficient documentation

## 2021-06-29 DIAGNOSIS — G4700 Insomnia, unspecified: Secondary | ICD-10-CM | POA: Insufficient documentation

## 2021-06-29 DIAGNOSIS — N183 Chronic kidney disease, stage 3 unspecified: Secondary | ICD-10-CM | POA: Insufficient documentation

## 2021-06-29 DIAGNOSIS — G3184 Mild cognitive impairment, so stated: Secondary | ICD-10-CM | POA: Diagnosis not present

## 2021-06-29 DIAGNOSIS — G5790 Unspecified mononeuropathy of unspecified lower limb: Secondary | ICD-10-CM | POA: Insufficient documentation

## 2021-06-29 DIAGNOSIS — F518 Other sleep disorders not due to a substance or known physiological condition: Secondary | ICD-10-CM | POA: Insufficient documentation

## 2021-06-29 DIAGNOSIS — R269 Unspecified abnormalities of gait and mobility: Secondary | ICD-10-CM | POA: Diagnosis not present

## 2021-06-29 DIAGNOSIS — I10 Essential (primary) hypertension: Secondary | ICD-10-CM | POA: Insufficient documentation

## 2021-06-29 DIAGNOSIS — Z86010 Personal history of colonic polyps: Secondary | ICD-10-CM | POA: Insufficient documentation

## 2021-06-29 DIAGNOSIS — Z760 Encounter for issue of repeat prescription: Secondary | ICD-10-CM | POA: Insufficient documentation

## 2021-06-29 DIAGNOSIS — H903 Sensorineural hearing loss, bilateral: Secondary | ICD-10-CM | POA: Insufficient documentation

## 2021-06-29 DIAGNOSIS — E119 Type 2 diabetes mellitus without complications: Secondary | ICD-10-CM | POA: Insufficient documentation

## 2021-06-29 DIAGNOSIS — M109 Gout, unspecified: Secondary | ICD-10-CM | POA: Insufficient documentation

## 2021-06-29 DIAGNOSIS — G4720 Circadian rhythm sleep disorder, unspecified type: Secondary | ICD-10-CM | POA: Insufficient documentation

## 2021-06-29 DIAGNOSIS — E749 Disorder of carbohydrate metabolism, unspecified: Secondary | ICD-10-CM | POA: Insufficient documentation

## 2021-06-29 DIAGNOSIS — F03A0 Unspecified dementia, mild, without behavioral disturbance, psychotic disturbance, mood disturbance, and anxiety: Secondary | ICD-10-CM | POA: Insufficient documentation

## 2021-06-29 NOTE — Telephone Encounter (Signed)
Please fax his June 29, 2021 note to his some Dr. Janeth Rase at [phone]
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
## 2021-06-30 LAB — TSH: TSH: 2.99 u[IU]/mL (ref 0.450–4.500)

## 2021-06-30 LAB — CBC WITH DIFFERENTIAL
Basophils Absolute: 0.1 10*3/uL (ref 0.0–0.2)
Basos: 1 %
EOS (ABSOLUTE): 0.4 10*3/uL (ref 0.0–0.4)
Eos: 6 %
Hematocrit: 39.6 % (ref 37.5–51.0)
Hemoglobin: 13.1 g/dL (ref 13.0–17.7)
Immature Grans (Abs): 0 10*3/uL (ref 0.0–0.1)
Immature Granulocytes: 0 %
Lymphocytes Absolute: 1.4 10*3/uL (ref 0.7–3.1)
Lymphs: 21 %
MCH: 31.9 pg (ref 26.6–33.0)
MCHC: 33.1 g/dL (ref 31.5–35.7)
MCV: 96 fL (ref 79–97)
Monocytes Absolute: 0.7 10*3/uL (ref 0.1–0.9)
Monocytes: 10 %
Neutrophils Absolute: 4.1 10*3/uL (ref 1.4–7.0)
Neutrophils: 62 %
RBC: 4.11 x10E6/uL — ABNORMAL LOW (ref 4.14–5.80)
RDW: 12.7 % (ref 11.6–15.4)
WBC: 6.7 10*3/uL (ref 3.4–10.8)

## 2021-06-30 LAB — RPR: RPR Ser Ql: NONREACTIVE

## 2021-06-30 LAB — COMPREHENSIVE METABOLIC PANEL
ALT: 25 IU/L (ref 0–44)
AST: 26 IU/L (ref 0–40)
Albumin/Globulin Ratio: 1.8 (ref 1.2–2.2)
Albumin: 4.4 g/dL (ref 3.6–4.6)
Alkaline Phosphatase: 73 IU/L (ref 44–121)
BUN/Creatinine Ratio: 14 (ref 10–24)
BUN: 27 mg/dL (ref 8–27)
Bilirubin Total: 0.3 mg/dL (ref 0.0–1.2)
CO2: 25 mmol/L (ref 20–29)
Calcium: 9.7 mg/dL (ref 8.6–10.2)
Chloride: 104 mmol/L (ref 96–106)
Creatinine, Ser: 1.92 mg/dL — ABNORMAL HIGH (ref 0.76–1.27)
Globulin, Total: 2.5 g/dL (ref 1.5–4.5)
Glucose: 110 mg/dL — ABNORMAL HIGH (ref 70–99)
Potassium: 5 mmol/L (ref 3.5–5.2)
Sodium: 140 mmol/L (ref 134–144)
Total Protein: 6.9 g/dL (ref 6.0–8.5)
eGFR: 33 mL/min/{1.73_m2} — ABNORMAL LOW (ref >59)

## 2021-06-30 LAB — VITAMIN B12: Vitamin B-12: 516 pg/mL (ref 232–1245)

## 2021-07-03 NOTE — Telephone Encounter (Addendum)
Records faxed to number below. Confirmation received.

## 2021-07-07 ENCOUNTER — Ambulatory Visit: Payer: Medicare Other | Admitting: Cardiology

## 2021-07-07 ENCOUNTER — Encounter: Payer: Self-pay | Admitting: Cardiology

## 2021-07-07 ENCOUNTER — Other Ambulatory Visit: Payer: Self-pay

## 2021-07-07 VITALS — BP 135/75 | HR 76 | Temp 97.7°F | Resp 17 | Ht 71.0 in | Wt 214.2 lb

## 2021-07-07 DIAGNOSIS — I25119 Atherosclerotic heart disease of native coronary artery with unspecified angina pectoris: Secondary | ICD-10-CM

## 2021-07-07 DIAGNOSIS — I10 Essential (primary) hypertension: Secondary | ICD-10-CM

## 2021-07-07 DIAGNOSIS — Z950 Presence of cardiac pacemaker: Secondary | ICD-10-CM | POA: Diagnosis not present

## 2021-07-07 DIAGNOSIS — I441 Atrioventricular block, second degree: Secondary | ICD-10-CM

## 2021-07-07 NOTE — Progress Notes (Signed)
Primary Physician/Referring:  [name], MD  Patient ID: Kevin Levine, male    DOB: Oct 11, 1931, 88 y.o.   MRN: [id number]  Chief Complaint  Patient presents with   Follow-up   clearance to move   HPI:    Kevin Levine  is a 88 y.o.  Caucasian male with CAD status post balloon angioplasty of left circumflex and stenting to distal left main and the proximal LAD in September 2016, hypertension, hyperlipidemia, stable stage 3 chronic kidney disease, and gout. 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   He underwent outpatient extended EKG monitoring for 48 hours on 05/04/2020, due to syncope, found to have Mobitz 2 AV block, hence underwent dual-chamber pacemaker implantation with [facility] on 06/15/2020.  No further episodes of syncope or dizziness.   Patient presents for 6 months visit. Patient is asymptomatic.  Denies chest pain, palpitations, syncope, near syncope, dizziness, orthopnea, PND, leg swelling.  Past Medical History:  Diagnosis Date   Arthritis    BPH (benign prostatic hyperplasia)    Chicken pox    Colon polyps    Coronary artery disease    ED (erectile dysfunction)    Encounter for care of pacemaker 06/22/2020   Essential hypertension    Gout    Heart disease    Hyperlipidemia    Hypertension    Kidney disease    Pacemaker: St Jude Medical Assurity MRI  dual-chamber pacemaker 06/15/2020 06/22/2020   Seasonal allergies    Second degree Mobitz II AV block 06/22/2020   Past Surgical History:  Procedure Laterality Date   APPENDECTOMY  2000   CATARACT EXTRACTION Bilateral 2017   CORONARY ANGIOPLASTY WITH STENT PLACEMENT  2016   LEAD REVISION/REPAIR N/A 06/20/2020   Procedure: LEAD REVISION/REPAIR;  Surgeon: Evans Lance, MD;  Location: [facility];  Service: Cardiovascular;  Laterality: N/A;   PACEMAKER IMPLANT N/A 06/15/2020   Procedure: PACEMAKER IMPLANT;  Surgeon: Thompson Grayer, MD;  Location: [facility];   Service: Cardiovascular;  Laterality: N/A;   Status post ballon angioplasty of the left circumflex and drug eluting stent to the distal left main into the proximal LAD   03/14/2015   Family History  Problem Relation Age of Onset   Lung cancer Mother    Aneurysm Brother    Cancer Paternal Grandfather    Heart attack Maternal Grandfather    Hypertension Father    Social History   Tobacco Use   Smoking status: Never   Smokeless tobacco: Never  Substance Use Topics   Alcohol use: Yes    Alcohol/week: 21.0 standard drinks    Types: 14 Glasses of wine, 7 Standard drinks or equivalent per week   Marital Status: Married  ROS  Review of Systems  Cardiovascular:  Negative for chest pain, dyspnea on exertion and leg swelling.  Gastrointestinal:  Negative for melena.  Objective  Blood pressure 135/75, pulse 76, temperature 97.7 F (36.5 C), temperature source Temporal, resp. rate 17, height 5\' 11"  (1.803 m), weight 214 lb 3.2 oz (97.2 kg), SpO2 97 %.  Vitals with BMI 07/07/2021 06/29/2021 03/07/2021  Height 5\' 11"  5\' 11"  5\' 11"   Weight 214 lbs 3 oz 216 lbs 207 lbs 6 oz  BMI 29.89 86.76 72.09  Systolic 470 962 836  Diastolic 75 69 72  Pulse 76 82 87     Physical Exam Neck:     Vascular: No carotid bruit or JVD.  Cardiovascular:  Rate and Rhythm: Normal rate and regular rhythm.     Pulses: Intact distal pulses.     Heart sounds: Normal heart sounds. No murmur heard.   No gallop.  Pulmonary:     Effort: Pulmonary effort is normal.     Breath sounds: Normal breath sounds.  Abdominal:     General: Bowel sounds are normal.     Palpations: Abdomen is soft.  Musculoskeletal:     Right lower leg: No edema.     Left lower leg: No edema.   Laboratory examination:   External labs :   Labs 04/12/2021:  Glucose 99   70-99 BUN  28   6-26 Creatinine 1.43   0.60-1.30 eGFR2021 47   >60 Sodium  140   136-145 Potassium 4.6   3.5-5.5  Hgb A1c  6.1   4.8-5.6  TSH   2.65   0.34-4.50    Cholesterol  106   <200 CHOL/HDL  2.3   2.0-4.0 HDLD   47   30-70 Triglyceride  94   0-199 NHDL   59   0-129 LDL Chol Calc (NIH) 41   0-99  Allergies   Allergies  Allergen Reactions   Donepezil Hcl     Other reaction(s): shaking      Medications Prior to Visit:   Final Medications at End of Visit    Current Meds  Medication Sig   acetaminophen (TYLENOL) 500 MG tablet Take 500 mg by mouth every 6 (six) hours as needed.   allopurinol (ZYLOPRIM) 100 MG tablet Take 1 tablet (100 mg total) by mouth daily.   aspirin EC 81 MG tablet Take 81 mg by mouth daily.   atorvastatin (LIPITOR) 80 MG tablet Take 1 tablet (80 mg total) by mouth daily. (Patient taking differently: Take 80 mg by mouth at bedtime.)   doxazosin (CARDURA) 4 MG tablet Take 4 mg by mouth daily.   ezetimibe (ZETIA) 10 MG tablet Take 10 mg by mouth at bedtime.   loratadine (CLARITIN) 10 MG tablet Take 10 mg by mouth daily as needed for allergies.    losartan (COZAAR) 100 MG tablet Take 1 tablet (100 mg total) by mouth daily.   metoprolol succinate (TOPROL-XL) 25 MG 24 hr tablet Take 25 mg by mouth daily.   Multiple Vitamins-Minerals (MULTIVITAMIN WITH MINERALS) tablet Take 1 tablet by mouth daily.    Radiology:  No results found.  Cardiac Studies:   Coronary Angiography 11/01/2014: S/P resolute DES 3.5 x 18 mm stent into the distal left main into the LAD and balloon angioplasty to circumflex arteon   Abdominal aortic duplex 10/16/2017: Mild diffuse calcific plaque noted in abdominal aorta. An abdominal aortic aneurysm measuring 2.85 x 2.82 x 2.73 cm is seen in mid aorta. There is mild ectasia of the abdominal aorta. The bilateral iliac arteries are also dilated at 15 mm. Normal velocity. Recheck in 2 years.  Pulse oximetry, overnight 03/12/2019: Study suggest very mild nocturnal hypoxemia.  ABI 11/17/2019: Right: Resting right ankle-brachial index is within normal range. No  evidence of significant right lower  extremity arterial disease. The right  toe-brachial index is normal. RT great toe pressure = 151 mmHg.  Left: Resting left ankle-brachial index indicates noncompressible left  lower extremity arteries. The left toe-brachial index is normal. LT Great  toe pressure = 179 mmHg.   Echocardiogram 01/22/2020:  Normal LV systolic function with visual EF 50-55%. Left ventricle cavity is normal in size. Mild left ventricular hypertrophy. Normal global wall motion. Indeterminate  diastolic filling pattern, normal LAP. Calculated EF 47%.  Left atrial cavity is moderately dilated.  Mild to moderate tricuspid regurgitation. No evidence of pulmonary hypertension. RVSP measures 33 mmHg.  IVC is dilated with a respiratory response of >50%.  Compared to previous study 10/16/2017 no significant change.   Exercise tetrofosmin stress test  02/01/2020: Normal ECG stress. The patient exercised for 4 minutes and 30 seconds of a Bruce protocol, achieving approximately 6.42 METs.  Normal BP response.  Low level exercise tolerance. PVC and V-Bigeminy and rare PVC in recovery.  Myocardial perfusion is normal. Overall LV systolic function is normal without regional wall motion abnormalities. Stress LV EF: 52%.  No significant change from 10/07/2017. No change in exercise tolerance. Low risk.   Device Clinic: St Jude Medical Assurity MRI  dual-chamber pacemaker 06/15/2020   Remote dual-chamber pacemaker transmission 06/19/2021: AP 18%, VP 1%.  Longevity 9 years and 10 months.  Lead impedance and thresholds within normal limits.  There were no high ventricular rate episodes, no mode switches.  Normal dual-chamber pacemaker function.  EKG   EKG 03/07/2021: Sinus rhythm with first-degree AV block at rate of 76 bpm, left atrial enlargement, left axis deviation, left anterior fascicular block.  Incomplete right bundle branch block.  No evidence of ischemia.  No significant change from 05/04/2020.    Assessment      ICD-10-CM   1. Atherosclerosis of native coronary artery of native heart with angina pectoris ([place])  I25.119     2. Essential hypertension  I10 CANCELED: EKG 12-Lead    3. Second degree Mobitz II AV block  I44.1     4. Pacemaker: [place] MRI  dual-chamber pacemaker 06/15/2020  Z95.0       No orders of the defined types were placed in this encounter.   Medications Discontinued During This Encounter  Medication Reason   diphenhydramine-acetaminophen (TYLENOL PM EXTRA STRENGTH) 25-500 MG TABS tablet    ipratropium (ATROVENT) 0.06 % nasal spray    zolpidem (AMBIEN) 5 MG tablet     No orders of the defined types were placed in this encounter.    Recommendations:   Kevin Levine  is a 88 y.o. Caucasian male with CAD status post balloon angioplasty of left circumflex and stenting to distal left main and the proximal LAD in September 2016, hypertension, hyperlipidemia, stable stage 3 chronic kidney disease, and gout. 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   He underwent outpatient extended EKG monitoring for 48 hours on 05/04/2020, due to syncope, found to have Mobitz 2 AV block, hence underwent dual-chamber pacemaker implantation with [facility] on 06/15/2020.  No further episodes of syncope or dizziness.   Patient presents for 6 months visit.  He remains angina free, no clinical evidence of heart failure, blood pressure is well controlled I reviewed his external labs.  Lipids are at goal.  He is now in the process of moving to [place], [place] at an independent living.  Pacemaker is functioning normally. I have forwarded my OV notes to the NP who works at the independent living. It was a please to take care of him over the years.   CC: Ouida Sills, ARNP Fax [phone]   Adrian Prows, PA-C 07/07/2021, 4:01 PM Office: [phone]

## 2021-07-10 ENCOUNTER — Telehealth: Payer: Self-pay | Admitting: Neurology

## 2021-07-10 NOTE — Telephone Encounter (Signed)
Medicare/tricare no Roxanna Mew , sent a message to Butch Penny she will reach out to the patient to schedule.

## 2021-07-12 DIAGNOSIS — I10 Essential (primary) hypertension: Secondary | ICD-10-CM | POA: Diagnosis not present

## 2021-07-13 ENCOUNTER — Ambulatory Visit (HOSPITAL_COMMUNITY)
Admission: RE | Admit: 2021-07-13 | Discharge: 2021-07-13 | Disposition: A | Discharge status: Home / Self Care | Payer: Medicare Other | Source: Ambulatory Visit | Attending: Neurology | Admitting: Neurology

## 2021-07-13 ENCOUNTER — Other Ambulatory Visit: Payer: Self-pay

## 2021-07-13 DIAGNOSIS — I639 Cerebral infarction, unspecified: Secondary | ICD-10-CM | POA: Diagnosis not present

## 2021-07-13 DIAGNOSIS — R269 Unspecified abnormalities of gait and mobility: Secondary | ICD-10-CM | POA: Insufficient documentation

## 2021-07-13 DIAGNOSIS — M21371 Foot drop, right foot: Secondary | ICD-10-CM | POA: Diagnosis not present

## 2021-07-13 DIAGNOSIS — R411 Anterograde amnesia: Secondary | ICD-10-CM | POA: Diagnosis not present

## 2021-07-13 DIAGNOSIS — G3184 Mild cognitive impairment, so stated: Secondary | ICD-10-CM | POA: Diagnosis not present

## 2021-07-13 NOTE — Progress Notes (Signed)
Carotid duplex has been completed.   Preliminary results in CV Proc.   Kevin Levine 07/13/2021 10:42 AM

## 2021-08-21 DIAGNOSIS — M1711 Unilateral primary osteoarthritis, right knee: Secondary | ICD-10-CM | POA: Diagnosis not present

## 2021-08-23 DIAGNOSIS — I10 Essential (primary) hypertension: Secondary | ICD-10-CM | POA: Diagnosis not present

## 2021-08-23 DIAGNOSIS — Z950 Presence of cardiac pacemaker: Secondary | ICD-10-CM | POA: Diagnosis not present

## 2021-08-23 DIAGNOSIS — G4700 Insomnia, unspecified: Secondary | ICD-10-CM | POA: Diagnosis not present

## 2021-08-23 DIAGNOSIS — M1990 Unspecified osteoarthritis, unspecified site: Secondary | ICD-10-CM | POA: Diagnosis not present

## 2021-08-23 DIAGNOSIS — N1831 Chronic kidney disease, stage 3a: Secondary | ICD-10-CM | POA: Diagnosis not present

## 2021-08-23 DIAGNOSIS — F0390 Unspecified dementia without behavioral disturbance: Secondary | ICD-10-CM | POA: Diagnosis not present

## 2021-08-23 DIAGNOSIS — M109 Gout, unspecified: Secondary | ICD-10-CM | POA: Diagnosis not present

## 2021-08-23 DIAGNOSIS — I714 Abdominal aortic aneurysm, without rupture, unspecified: Secondary | ICD-10-CM | POA: Diagnosis not present

## 2021-08-23 DIAGNOSIS — I2510 Atherosclerotic heart disease of native coronary artery without angina pectoris: Secondary | ICD-10-CM | POA: Diagnosis not present

## 2021-08-23 DIAGNOSIS — E7800 Pure hypercholesterolemia, unspecified: Secondary | ICD-10-CM | POA: Diagnosis not present

## 2021-10-04 DIAGNOSIS — R42 Dizziness and giddiness: Secondary | ICD-10-CM | POA: Diagnosis not present

## 2021-10-05 ENCOUNTER — Other Ambulatory Visit: Payer: Self-pay | Admitting: Internal Medicine

## 2021-10-05 DIAGNOSIS — R42 Dizziness and giddiness: Secondary | ICD-10-CM

## 2021-10-06 ENCOUNTER — Other Ambulatory Visit (HOSPITAL_COMMUNITY): Payer: Self-pay | Admitting: Internal Medicine

## 2021-10-06 DIAGNOSIS — R42 Dizziness and giddiness: Secondary | ICD-10-CM

## 2021-10-11 DIAGNOSIS — M109 Gout, unspecified: Secondary | ICD-10-CM | POA: Diagnosis not present

## 2021-10-11 DIAGNOSIS — F0390 Unspecified dementia without behavioral disturbance: Secondary | ICD-10-CM | POA: Diagnosis not present

## 2021-10-11 DIAGNOSIS — E7800 Pure hypercholesterolemia, unspecified: Secondary | ICD-10-CM | POA: Diagnosis not present

## 2021-10-11 DIAGNOSIS — I10 Essential (primary) hypertension: Secondary | ICD-10-CM | POA: Diagnosis not present

## 2021-10-11 DIAGNOSIS — Z950 Presence of cardiac pacemaker: Secondary | ICD-10-CM | POA: Diagnosis not present

## 2021-10-11 DIAGNOSIS — N183 Chronic kidney disease, stage 3 unspecified: Secondary | ICD-10-CM | POA: Diagnosis not present

## 2021-10-11 DIAGNOSIS — I714 Abdominal aortic aneurysm, without rupture, unspecified: Secondary | ICD-10-CM | POA: Diagnosis not present

## 2021-10-11 DIAGNOSIS — R7303 Prediabetes: Secondary | ICD-10-CM | POA: Diagnosis not present

## 2021-10-13 ENCOUNTER — Encounter (HOSPITAL_COMMUNITY): Payer: Self-pay

## 2021-10-13 ENCOUNTER — Ambulatory Visit (HOSPITAL_COMMUNITY)
Admission: RE | Admit: 2021-10-13 | Discharge: 2021-10-13 | Disposition: A | Discharge status: Home / Self Care | Payer: Medicare Other | Source: Ambulatory Visit | Attending: Internal Medicine | Admitting: Internal Medicine

## 2021-10-13 NOTE — Progress Notes (Signed)
Pt did not show up for his MRI appt today. ?

## 2021-10-26 ENCOUNTER — Ambulatory Visit (HOSPITAL_COMMUNITY)
Admission: RE | Admit: 2021-10-26 | Discharge: 2021-10-26 | Disposition: A | Discharge status: Home / Self Care | Payer: Medicare Other | Source: Ambulatory Visit | Attending: Internal Medicine | Admitting: Internal Medicine

## 2021-10-26 DIAGNOSIS — R42 Dizziness and giddiness: Secondary | ICD-10-CM | POA: Diagnosis not present

## 2021-10-26 IMAGING — MR MR HEAD W/O CM
9 of 10 series · 38 of 48 positions shown · non-contrast
Comparison: Head MRI [DATE].

CLINICAL DATA: Dizziness.

EXAM:
MRI HEAD WITHOUT CONTRAST
TECHNIQUE: Multiplanar, multiecho pulse sequences of the brain and surrounding
structures were obtained without intravenous contrast.

[Series 4: DWI · axial · 3.0mm · 1.09mm/px · z∈[-70,+95]mm · 11 of 112 slices shown (1 of 4)]
[im 1/112]
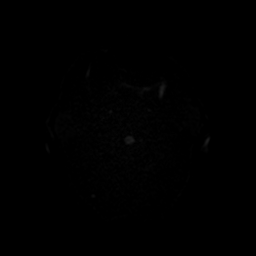
[im 12/112]
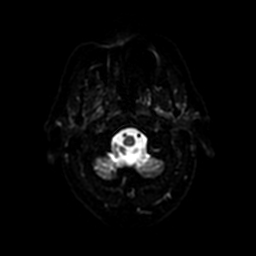
[im 23/112]
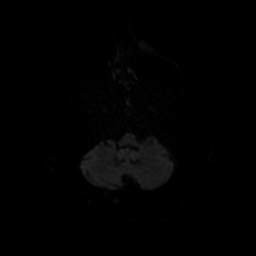
[im 34/112]
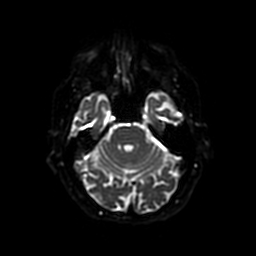
[im 45/112]
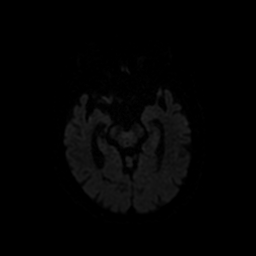
[im 56/112]
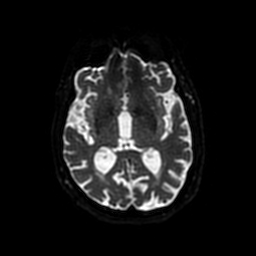
[im 67/112]
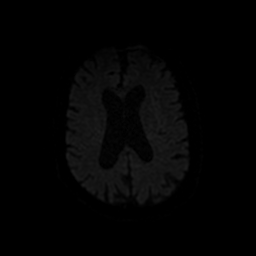
[im 78/112]
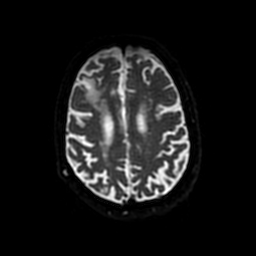
[im 89/112]
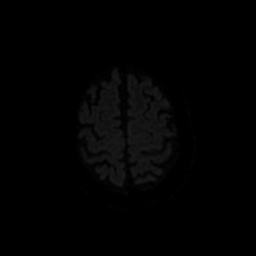
[im 100/112]
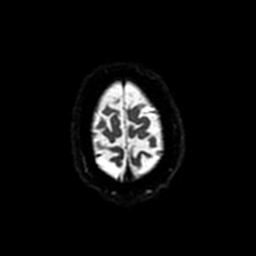
[im 112/112]
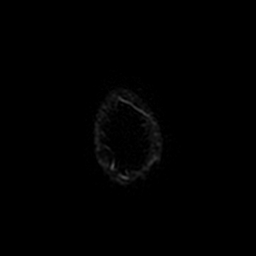

[Series 5: DWI · coronal · 5.0mm · 1.09mm/px · 8 of 80 slices shown (2 of 4)]
[im 1/80]
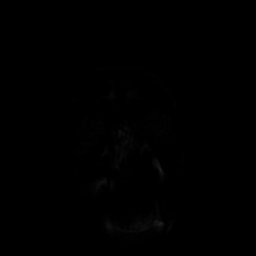
[im 12/80]
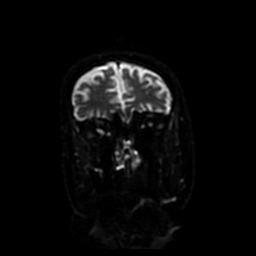
[im 23/80]
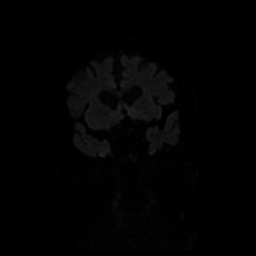
[im 34/80]
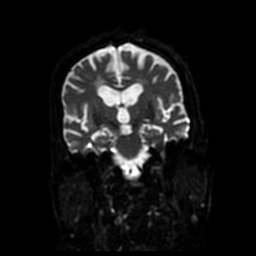
[im 46/80]
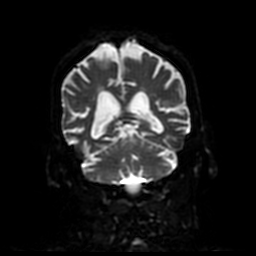
[im 57/80]
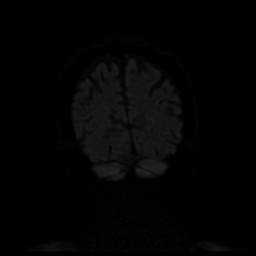
[im 68/80]
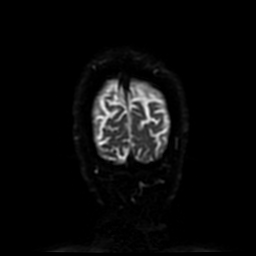
[im 80/80]
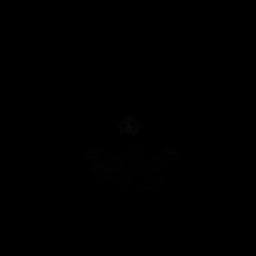

[Series 6: T1 · sagittal · 5.0mm · 0.47mm/px · 2 of 23 slices shown (1 of 2)]
[im 1/23]
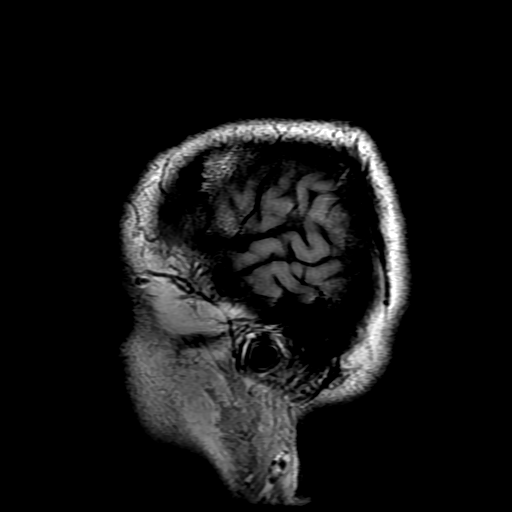
[im 23/23]
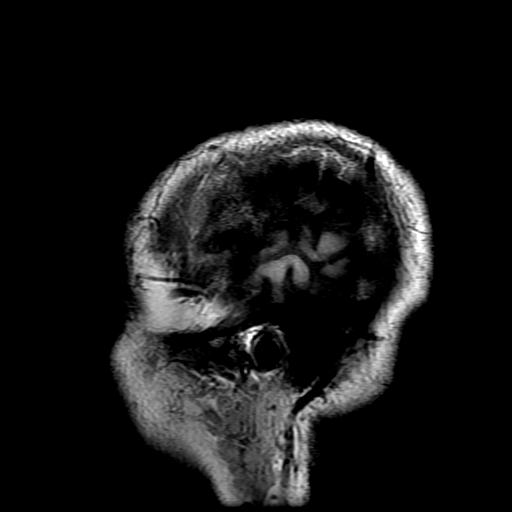

[Series 7: T2 · axial · 5.0mm · 0.45mm/px · z∈[-73,+82]mm · 2 of 27 slices shown (1 of 2)]
[im 1/27]
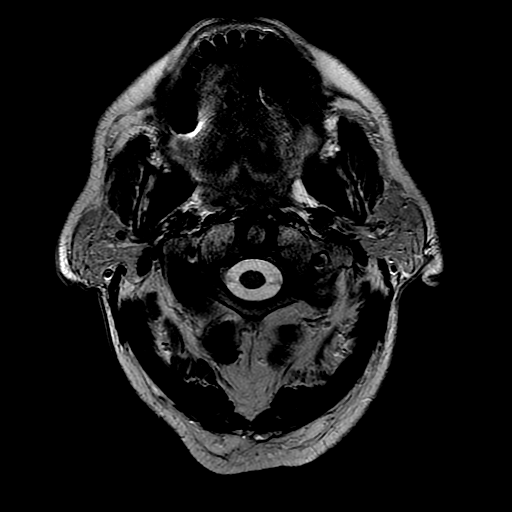
[im 27/27]
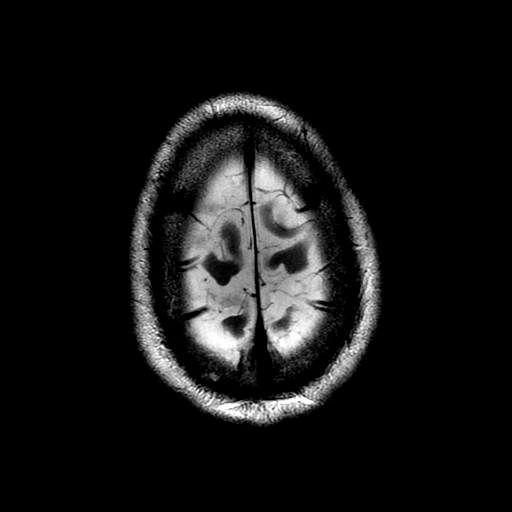

[Series 8: FLAIR · axial · 3.0mm · 0.45mm/px · z∈[-73,+82]mm · 2 of 27 slices shown]
[im 1/27]
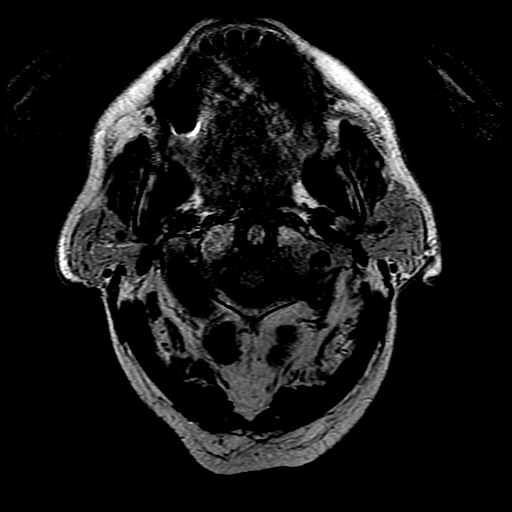
[im 27/27]
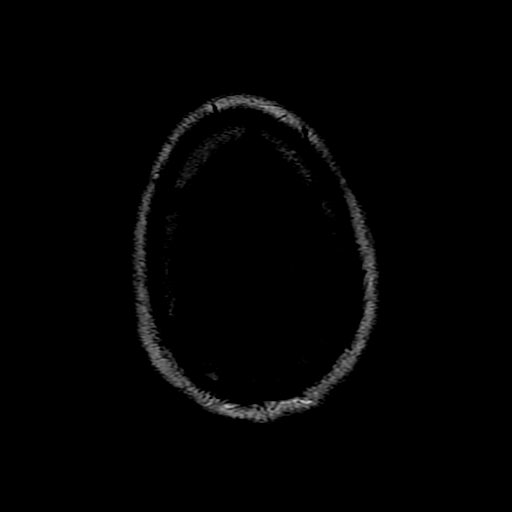

[Series 10: T1 · axial · 3.0mm · 0.45mm/px · z∈[-75,-58]mm · 2 of 108 slices shown (2 of 2)]
[im 1/108]
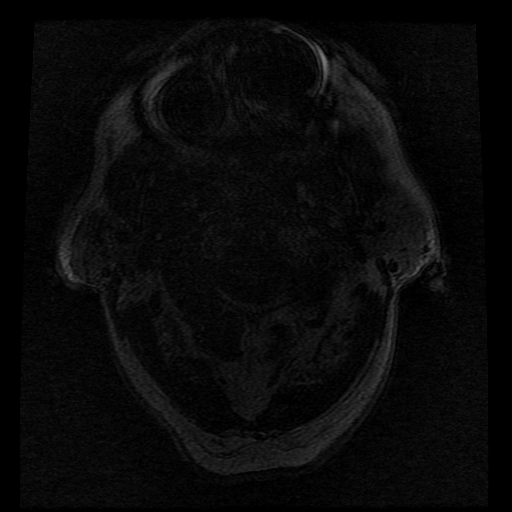
[im 12/108]
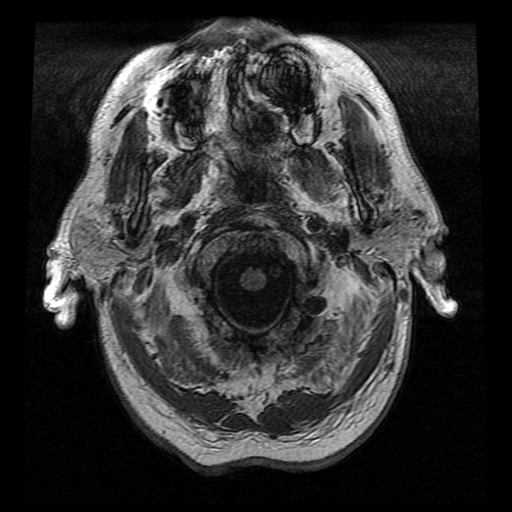

[Series 11: T2 · coronal · 5.0mm · 0.41mm/px · 2 of 24 slices shown (2 of 2)]
[im 1/24]
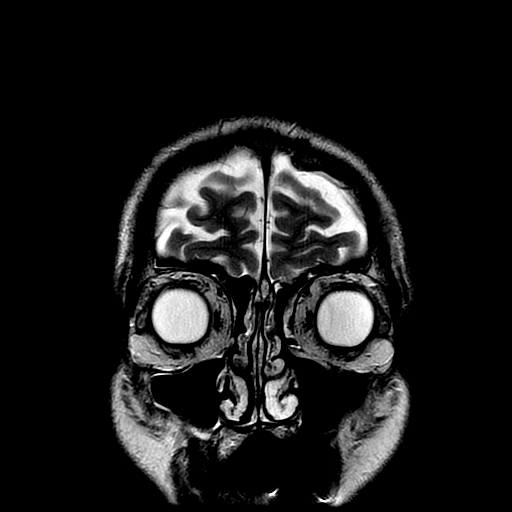
[im 24/24]
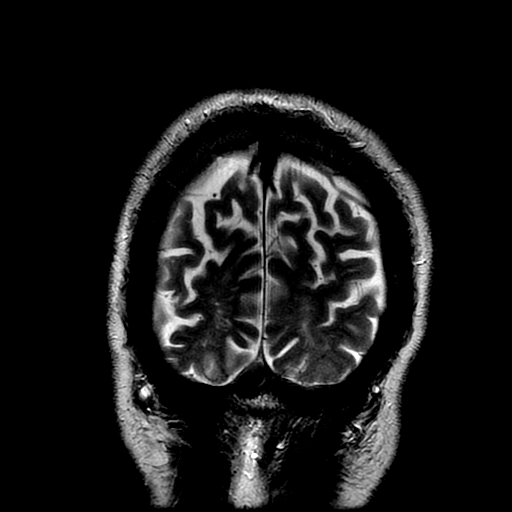

[Series 400: DWI · axial · 3.0mm · 1.09mm/px · z∈[-70,+95]mm · 5 of 56 slices shown (3 of 4)]
[im 1/56]
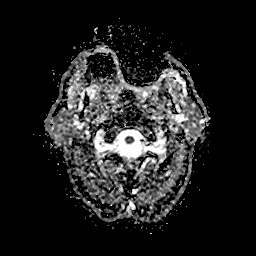
[im 14/56]
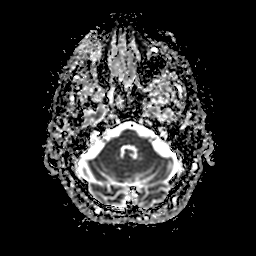
[im 28/56]
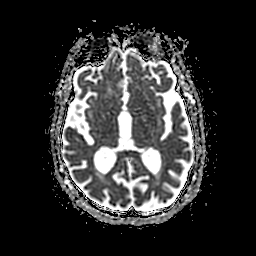
[im 42/56]
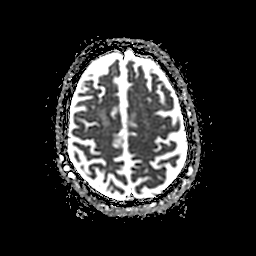
[im 56/56]
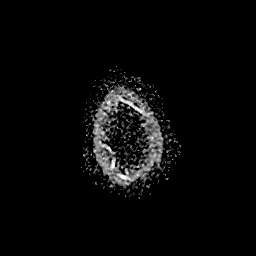

[Series 500: DWI · coronal · 5.0mm · 1.09mm/px · 4 of 40 slices shown (4 of 4)]
[im 1/40]
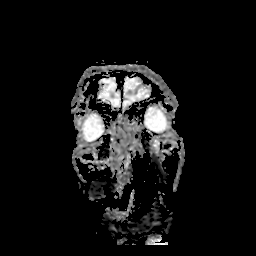
[im 14/40]
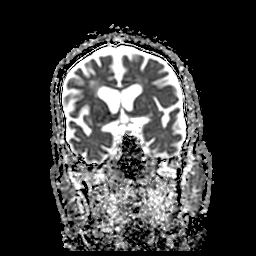
[im 27/40]
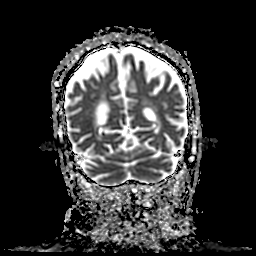
[im 40/40]
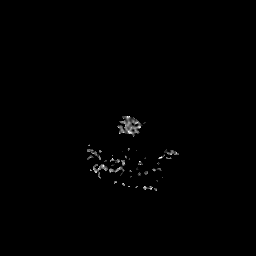

[38 of 48 positions shown; findings below may reference images not displayed]

FINDINGS: Brain: There is no evidence of an acute infarct, intracranial
hemorrhage, midline shift, or extra-axial fluid collection. A 1.6 cm
lipoma in the quadrigeminal cistern on the left is unchanged. Small
chronic cortical infarcts are again seen in the right frontal and
right parietal lobes. T2 hyperintensities in the cerebral white
matter bilaterally are unchanged and nonspecific but compatible with
mild-to-moderate chronic small vessel ischemic disease. There is
moderate cerebral atrophy. No focal cerebellar insult is identified.

Vascular: Major intracranial vascular flow voids are preserved.

Skull and upper cervical spine: Unremarkable bone marrow signal.

Sinuses/Orbits: Bilateral cataract extraction. Mild mucosal
thickening in the paranasal sinuses. Trace bilateral mastoid fluid.

Other: None.
IMPRESSION: 1. No acute intracranial abnormality.
2. Mild-to-moderate chronic small vessel ischemic disease and
cerebral atrophy.
3. Unchanged small chronic right frontal and right parietal
infarcts.

## 2021-10-26 NOTE — Progress Notes (Signed)
Informed of MRI for today.  ? ?Device system confirmed to be MRI conditional, with implant date > 6 weeks ago, and no evidence of abandoned or epicardial leads in review of most recent CXR ?Interrogation from today reviewed, pt is currently AS-VS with minimal pacing ? ?No changes required per review with industry.  ? ?Tachy-therapies to off if applicable. ? ?Program device back to pre-MRI settings after completion of exam. ? ?Tremain Rucinski, PA-C  ?10/26/2021 12:28 PM   ?

## 2021-10-26 NOTE — Progress Notes (Signed)
Patient here today at [facility] for MRI brain wo contrast. Patient has [employer].Jude device. Transmission sent. No changes necessary at this time per rep. Will send post transmission once scan is completed.  ?

## 2021-11-01 ENCOUNTER — Ambulatory Visit
Admission: RE | Admit: 2021-11-01 | Discharge: 2021-11-01 | Disposition: A | Discharge status: Home / Self Care | Payer: Medicare Other | Source: Ambulatory Visit | Attending: Sports Medicine | Admitting: Sports Medicine

## 2021-11-01 ENCOUNTER — Other Ambulatory Visit: Payer: Self-pay | Admitting: Sports Medicine

## 2021-11-01 DIAGNOSIS — M25561 Pain in right knee: Secondary | ICD-10-CM

## 2021-11-01 DIAGNOSIS — M1711 Unilateral primary osteoarthritis, right knee: Secondary | ICD-10-CM | POA: Diagnosis not present

## 2021-11-01 IMAGING — DX DG KNEE 3 VIEWS*R*
3 series · 3 of 3 positions shown · non-contrast
Comparison: Right knee radiographs [DATE]

CLINICAL DATA: Chronic right knee pain.

EXAM:
RIGHT KNEE - 3 VIEW

[dg knee 3 views right (1 of 3)]
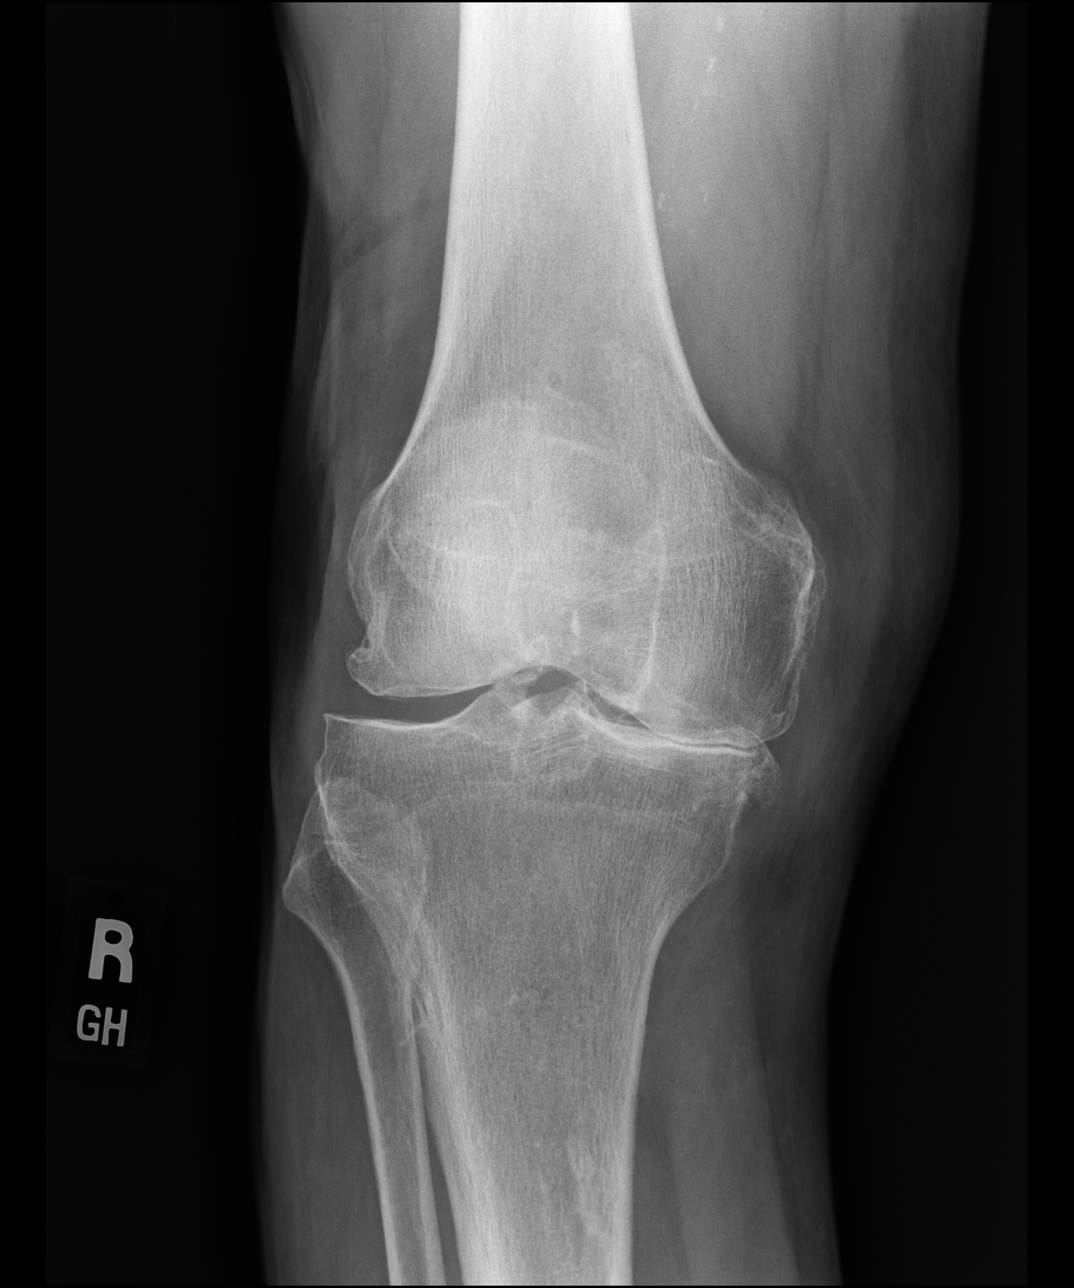

[dg knee 3 views right (2 of 3)]
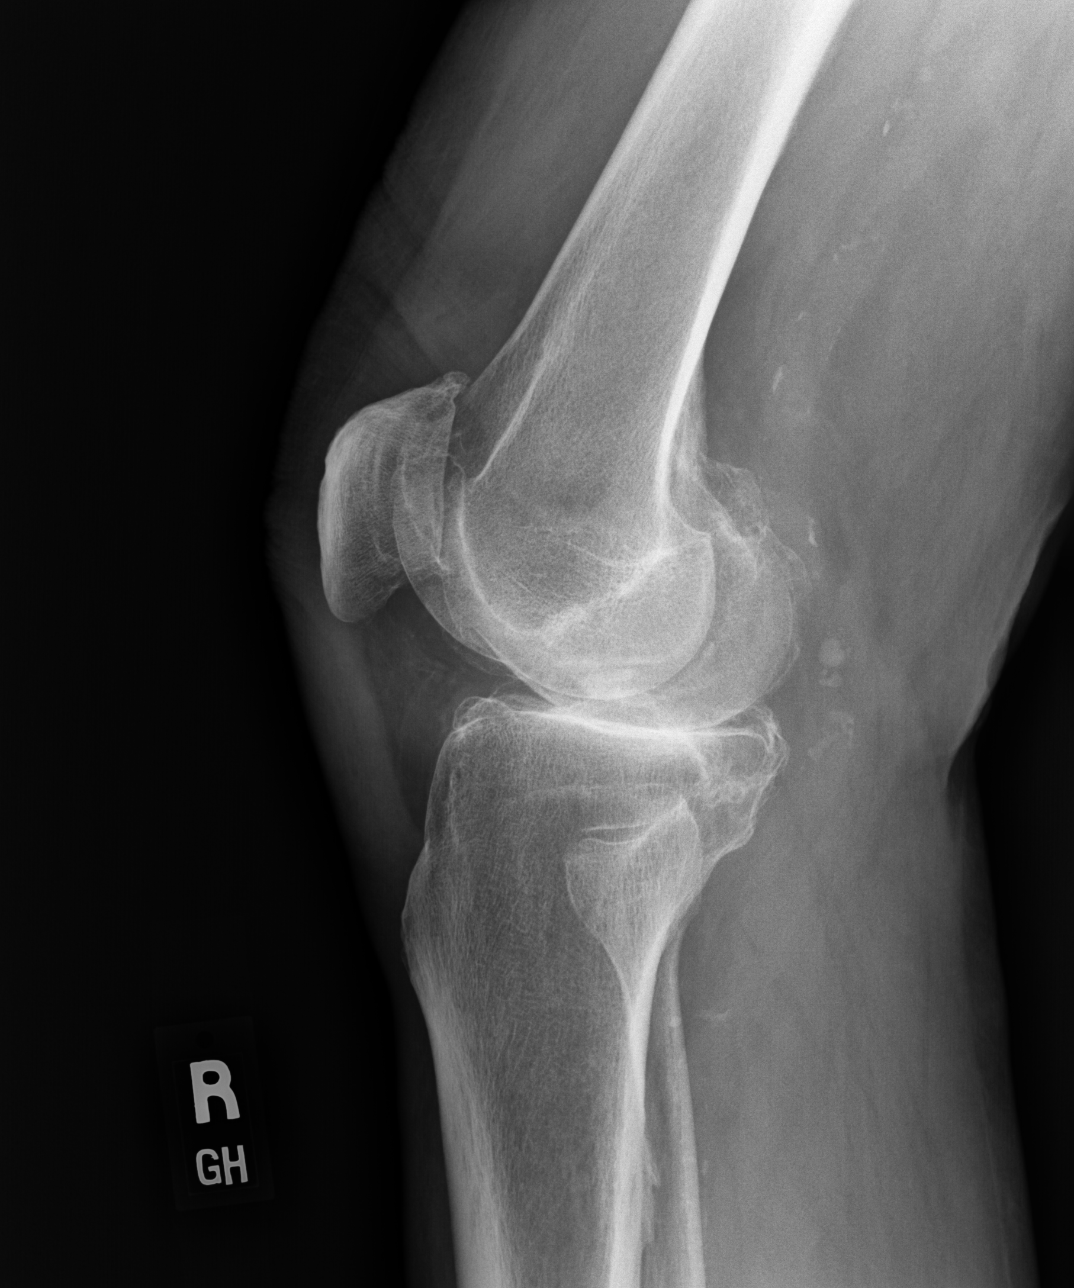

[dg knee 3 views right (3 of 3)]
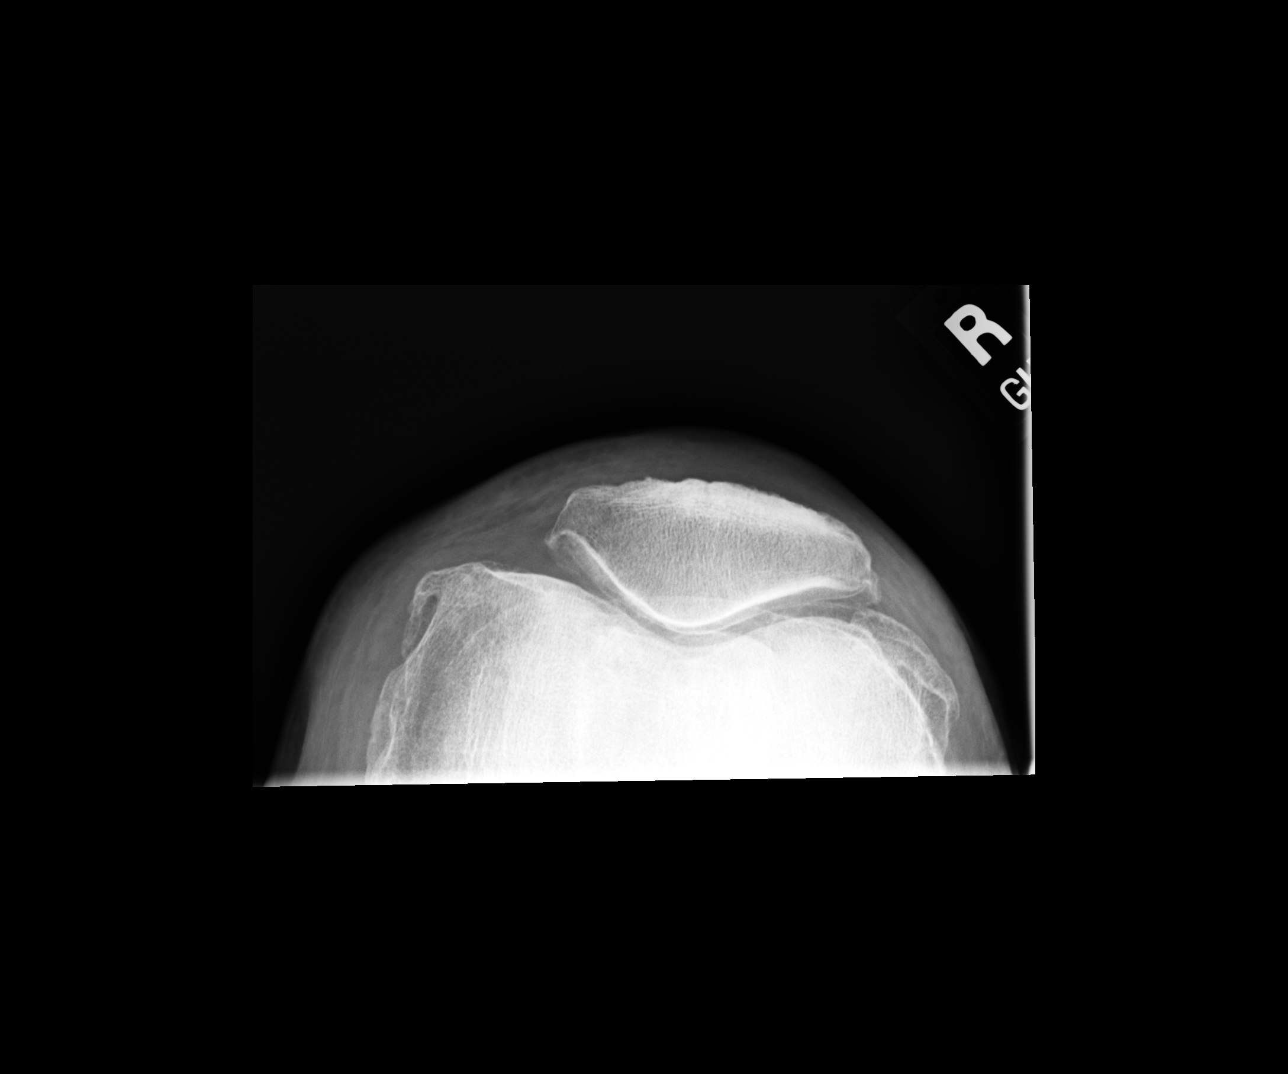

[3 of 3 positions shown; findings below may reference images not displayed]

FINDINGS: Severe medial joint space narrowing bone-on-bone contact and
moderate peripheral degenerative osteophytes. Mild peripheral
lateral compartment degenerative osteophytes. Severe patellofemoral
joint space narrowing with moderate to severe peripheral
degenerative osteophytosis. Tiny joint effusion. No acute fracture
or dislocation. Mild vascular calcifications.
IMPRESSION: Severe medial compartment and patellofemoral compartment
osteoarthritis.

## 2021-11-04 ENCOUNTER — Other Ambulatory Visit (HOSPITAL_COMMUNITY): Payer: Medicare Other

## 2021-11-15 DIAGNOSIS — R2689 Other abnormalities of gait and mobility: Secondary | ICD-10-CM | POA: Diagnosis not present

## 2021-11-15 DIAGNOSIS — M25561 Pain in right knee: Secondary | ICD-10-CM | POA: Diagnosis not present

## 2021-11-22 DIAGNOSIS — M1711 Unilateral primary osteoarthritis, right knee: Secondary | ICD-10-CM | POA: Diagnosis not present

## 2021-11-24 DIAGNOSIS — R2689 Other abnormalities of gait and mobility: Secondary | ICD-10-CM | POA: Diagnosis not present

## 2021-11-24 DIAGNOSIS — M25561 Pain in right knee: Secondary | ICD-10-CM | POA: Diagnosis not present

## 2021-12-08 DIAGNOSIS — M25561 Pain in right knee: Secondary | ICD-10-CM | POA: Diagnosis not present

## 2021-12-08 DIAGNOSIS — R2689 Other abnormalities of gait and mobility: Secondary | ICD-10-CM | POA: Diagnosis not present

## 2021-12-18 DIAGNOSIS — Z45018 Encounter for adjustment and management of other part of cardiac pacemaker: Secondary | ICD-10-CM | POA: Diagnosis not present

## 2021-12-25 DIAGNOSIS — M25561 Pain in right knee: Secondary | ICD-10-CM | POA: Diagnosis not present

## 2021-12-25 DIAGNOSIS — R2689 Other abnormalities of gait and mobility: Secondary | ICD-10-CM | POA: Diagnosis not present

## 2021-12-29 ENCOUNTER — Telehealth: Payer: Self-pay

## 2021-12-29 NOTE — Telephone Encounter (Signed)
Patient called, he is moving to [place] and will have his new cardiologist call to request transfer from [place]. He would like for you to give him a call, nothing urgent, just wants to say his "see you laters".

## 2022-01-04 NOTE — Telephone Encounter (Signed)
Called 2-3 times and finally left 2nd message

## 2022-02-06 DIAGNOSIS — I6523 Occlusion and stenosis of bilateral carotid arteries: Secondary | ICD-10-CM | POA: Diagnosis not present

## 2022-02-06 DIAGNOSIS — I4891 Unspecified atrial fibrillation: Secondary | ICD-10-CM | POA: Diagnosis not present

## 2022-02-06 DIAGNOSIS — E790 Hyperuricemia without signs of inflammatory arthritis and tophaceous disease: Secondary | ICD-10-CM | POA: Diagnosis not present

## 2022-02-06 DIAGNOSIS — N400 Enlarged prostate without lower urinary tract symptoms: Secondary | ICD-10-CM | POA: Diagnosis not present

## 2022-02-06 DIAGNOSIS — I714 Abdominal aortic aneurysm, without rupture, unspecified: Secondary | ICD-10-CM | POA: Diagnosis not present

## 2022-02-06 DIAGNOSIS — E785 Hyperlipidemia, unspecified: Secondary | ICD-10-CM | POA: Diagnosis not present

## 2022-02-06 DIAGNOSIS — R011 Cardiac murmur, unspecified: Secondary | ICD-10-CM | POA: Diagnosis not present

## 2022-02-06 DIAGNOSIS — I2510 Atherosclerotic heart disease of native coronary artery without angina pectoris: Secondary | ICD-10-CM | POA: Diagnosis not present

## 2022-02-06 DIAGNOSIS — I10 Essential (primary) hypertension: Secondary | ICD-10-CM | POA: Diagnosis not present

## 2022-02-06 DIAGNOSIS — M109 Gout, unspecified: Secondary | ICD-10-CM | POA: Diagnosis not present

## 2022-02-06 DIAGNOSIS — I491 Atrial premature depolarization: Secondary | ICD-10-CM | POA: Diagnosis not present

## 2022-02-06 DIAGNOSIS — R7303 Prediabetes: Secondary | ICD-10-CM | POA: Diagnosis not present

## 2022-02-08 DIAGNOSIS — N400 Enlarged prostate without lower urinary tract symptoms: Secondary | ICD-10-CM | POA: Diagnosis not present

## 2022-02-08 DIAGNOSIS — I10 Essential (primary) hypertension: Secondary | ICD-10-CM | POA: Diagnosis not present

## 2022-02-08 DIAGNOSIS — E785 Hyperlipidemia, unspecified: Secondary | ICD-10-CM | POA: Diagnosis not present

## 2022-02-08 DIAGNOSIS — E790 Hyperuricemia without signs of inflammatory arthritis and tophaceous disease: Secondary | ICD-10-CM | POA: Diagnosis not present

## 2022-02-08 DIAGNOSIS — R7303 Prediabetes: Secondary | ICD-10-CM | POA: Diagnosis not present

## 2022-02-08 DIAGNOSIS — I491 Atrial premature depolarization: Secondary | ICD-10-CM | POA: Diagnosis not present

## 2022-03-02 DIAGNOSIS — E785 Hyperlipidemia, unspecified: Secondary | ICD-10-CM | POA: Diagnosis not present

## 2022-03-02 DIAGNOSIS — M109 Gout, unspecified: Secondary | ICD-10-CM | POA: Diagnosis not present

## 2022-03-02 DIAGNOSIS — I2510 Atherosclerotic heart disease of native coronary artery without angina pectoris: Secondary | ICD-10-CM | POA: Diagnosis not present

## 2022-03-02 DIAGNOSIS — I480 Paroxysmal atrial fibrillation: Secondary | ICD-10-CM | POA: Diagnosis not present

## 2022-03-02 DIAGNOSIS — I10 Essential (primary) hypertension: Secondary | ICD-10-CM | POA: Diagnosis not present

## 2022-03-02 DIAGNOSIS — I714 Abdominal aortic aneurysm, without rupture, unspecified: Secondary | ICD-10-CM | POA: Diagnosis not present

## 2022-03-02 DIAGNOSIS — H60312 Diffuse otitis externa, left ear: Secondary | ICD-10-CM | POA: Diagnosis not present

## 2022-03-02 DIAGNOSIS — R011 Cardiac murmur, unspecified: Secondary | ICD-10-CM | POA: Diagnosis not present

## 2022-03-02 DIAGNOSIS — R972 Elevated prostate specific antigen [PSA]: Secondary | ICD-10-CM | POA: Diagnosis not present

## 2022-03-02 DIAGNOSIS — E790 Hyperuricemia without signs of inflammatory arthritis and tophaceous disease: Secondary | ICD-10-CM | POA: Diagnosis not present

## 2022-03-02 DIAGNOSIS — N400 Enlarged prostate without lower urinary tract symptoms: Secondary | ICD-10-CM | POA: Diagnosis not present

## 2022-03-02 DIAGNOSIS — N1831 Chronic kidney disease, stage 3a: Secondary | ICD-10-CM | POA: Diagnosis not present

## 2022-03-20 ENCOUNTER — Encounter: Payer: Self-pay | Admitting: Cardiology

## 2022-03-21 ENCOUNTER — Telehealth: Payer: Self-pay | Admitting: Cardiology

## 2022-03-21 NOTE — Telephone Encounter (Signed)
I have released the device from our clinic.  Patient has to just contact his new cardiologist and asked them to enroll in their clinic.  If there is any problem they can call us or they can call the company.

## 2022-03-21 NOTE — Telephone Encounter (Signed)
Kevin Levine, calling on behalf of patient. Says patient is relocating to [facility], and he has a concern about the patient's merlin device (pacer). He has questions about switching this over, and relocating to [facility] [place] with a new cardiologist. Please call him at [phone].

## 2022-03-22 NOTE — Telephone Encounter (Signed)
Spoke with Daneen Schick, he was going into an implant procedure, will call back after he is done.

## 2022-04-05 NOTE — Telephone Encounter (Signed)
I have attempted several times to contact patient and [name]. NA, LMAM and no call back from either.

## 2022-06-07 ENCOUNTER — Telehealth: Payer: Self-pay | Admitting: Orthopedic Surgery

## 2022-06-07 NOTE — Telephone Encounter (Signed)
Patient states he wants to speak to Dr. Marlou Sa about a new type of Knee surgery. [phone]

## 2022-06-12 NOTE — Telephone Encounter (Signed)
lmom
